# Patient Record
Sex: Male | Born: 1945 | Race: White | Hispanic: No | Smoking: Current every day smoker
Health system: Southern US, Community
[De-identification: ages and names within clinical notes are randomized; demographics above are authoritative.]

## PROBLEM LIST (undated history)

## (undated) DIAGNOSIS — M2550 Pain in unspecified joint: Secondary | ICD-10-CM

## (undated) DIAGNOSIS — K219 Gastro-esophageal reflux disease without esophagitis: Secondary | ICD-10-CM

## (undated) DIAGNOSIS — G5603 Carpal tunnel syndrome, bilateral upper limbs: Secondary | ICD-10-CM

## (undated) DIAGNOSIS — I10 Essential (primary) hypertension: Secondary | ICD-10-CM

## (undated) DIAGNOSIS — Z800 Family history of malignant neoplasm of digestive organs: Secondary | ICD-10-CM

## (undated) DIAGNOSIS — IMO0002 Reserved for concepts with insufficient information to code with codable children: Secondary | ICD-10-CM

## (undated) DIAGNOSIS — Z860100 Personal history of colon polyps, unspecified: Secondary | ICD-10-CM

## (undated) DIAGNOSIS — E119 Type 2 diabetes mellitus without complications: Secondary | ICD-10-CM

## (undated) DIAGNOSIS — B182 Chronic viral hepatitis C: Secondary | ICD-10-CM

## (undated) DIAGNOSIS — F32A Depression, unspecified: Secondary | ICD-10-CM

## (undated) DIAGNOSIS — M2540 Effusion, unspecified joint: Secondary | ICD-10-CM

## (undated) DIAGNOSIS — F329 Major depressive disorder, single episode, unspecified: Secondary | ICD-10-CM

## (undated) DIAGNOSIS — E785 Hyperlipidemia, unspecified: Secondary | ICD-10-CM

## (undated) DIAGNOSIS — M1711 Unilateral primary osteoarthritis, right knee: Secondary | ICD-10-CM

## (undated) HISTORY — DX: Depression, unspecified: F32.A

## (undated) HISTORY — DX: Major depressive disorder, single episode, unspecified: F32.9

## (undated) HISTORY — DX: Hyperlipidemia, unspecified: E78.5

## (undated) HISTORY — DX: Reserved for concepts with insufficient information to code with codable children: IMO0002

## (undated) HISTORY — DX: Carpal tunnel syndrome, bilateral upper limbs: G56.03

## (undated) HISTORY — DX: Family history of malignant neoplasm of digestive organs: Z80.0

## (undated) HISTORY — DX: Unilateral primary osteoarthritis, right knee: M17.11

## (undated) HISTORY — PX: HERNIA REPAIR: SHX51

## (undated) HISTORY — DX: Essential (primary) hypertension: I10

## (undated) HISTORY — DX: Chronic viral hepatitis C: B18.2

## (undated) HISTORY — DX: Type 2 diabetes mellitus without complications: E11.9

---

## 1999-04-12 ENCOUNTER — Ambulatory Visit (HOSPITAL_COMMUNITY): Admission: RE | Admit: 1999-04-12 | Discharge: 1999-04-12 | Payer: Self-pay | Admitting: Gastroenterology

## 2000-11-27 ENCOUNTER — Encounter: Admission: RE | Admit: 2000-11-27 | Discharge: 2000-11-27 | Payer: Self-pay | Admitting: Internal Medicine

## 2000-11-27 ENCOUNTER — Encounter: Payer: Self-pay | Admitting: Internal Medicine

## 2002-03-13 ENCOUNTER — Emergency Department (HOSPITAL_COMMUNITY): Admission: EM | Admit: 2002-03-13 | Discharge: 2002-03-13 | Payer: Self-pay | Admitting: Emergency Medicine

## 2002-03-13 ENCOUNTER — Encounter: Payer: Self-pay | Admitting: Emergency Medicine

## 2003-06-06 ENCOUNTER — Encounter: Admission: RE | Admit: 2003-06-06 | Discharge: 2003-06-06 | Payer: Self-pay | Admitting: Neurology

## 2003-06-06 ENCOUNTER — Encounter: Payer: Self-pay | Admitting: Neurology

## 2003-11-18 HISTORY — PX: OTHER SURGICAL HISTORY: SHX169

## 2004-01-18 ENCOUNTER — Ambulatory Visit (HOSPITAL_COMMUNITY): Admission: RE | Admit: 2004-01-18 | Discharge: 2004-01-18 | Payer: Self-pay | Admitting: Gastroenterology

## 2004-05-04 ENCOUNTER — Ambulatory Visit (HOSPITAL_COMMUNITY): Admission: RE | Admit: 2004-05-04 | Discharge: 2004-05-04 | Payer: Self-pay | Admitting: Gastroenterology

## 2004-07-16 ENCOUNTER — Ambulatory Visit: Payer: Self-pay | Admitting: Gastroenterology

## 2004-08-27 ENCOUNTER — Ambulatory Visit: Payer: Self-pay | Admitting: Gastroenterology

## 2004-09-06 ENCOUNTER — Ambulatory Visit: Payer: Self-pay | Admitting: Gastroenterology

## 2004-09-20 ENCOUNTER — Ambulatory Visit: Payer: Self-pay | Admitting: Gastroenterology

## 2004-10-18 ENCOUNTER — Ambulatory Visit: Payer: Self-pay | Admitting: Internal Medicine

## 2004-11-22 ENCOUNTER — Ambulatory Visit: Payer: Self-pay | Admitting: Gastroenterology

## 2005-01-03 ENCOUNTER — Ambulatory Visit: Payer: Self-pay | Admitting: Internal Medicine

## 2005-01-31 ENCOUNTER — Ambulatory Visit: Payer: Self-pay | Admitting: Internal Medicine

## 2005-10-27 ENCOUNTER — Emergency Department (HOSPITAL_COMMUNITY): Admission: EM | Admit: 2005-10-27 | Discharge: 2005-10-28 | Payer: Self-pay | Admitting: Emergency Medicine

## 2006-06-04 ENCOUNTER — Encounter: Admission: RE | Admit: 2006-06-04 | Discharge: 2006-06-04 | Payer: Self-pay | Admitting: Neurology

## 2006-06-18 ENCOUNTER — Emergency Department (HOSPITAL_COMMUNITY): Admission: EM | Admit: 2006-06-18 | Discharge: 2006-06-18 | Payer: Self-pay | Admitting: Emergency Medicine

## 2007-07-04 ENCOUNTER — Emergency Department (HOSPITAL_COMMUNITY): Admission: EM | Admit: 2007-07-04 | Discharge: 2007-07-04 | Payer: Self-pay | Admitting: Emergency Medicine

## 2007-08-20 HISTORY — PX: OTHER SURGICAL HISTORY: SHX169

## 2007-11-03 ENCOUNTER — Emergency Department (HOSPITAL_COMMUNITY): Admission: EM | Admit: 2007-11-03 | Discharge: 2007-11-04 | Payer: Self-pay | Admitting: Emergency Medicine

## 2007-12-14 ENCOUNTER — Ambulatory Visit (HOSPITAL_COMMUNITY): Admission: RE | Admit: 2007-12-14 | Discharge: 2007-12-14 | Payer: Self-pay | Admitting: *Deleted

## 2008-03-13 ENCOUNTER — Emergency Department (HOSPITAL_COMMUNITY): Admission: EM | Admit: 2008-03-13 | Discharge: 2008-03-13 | Payer: Self-pay | Admitting: Emergency Medicine

## 2008-03-21 ENCOUNTER — Emergency Department (HOSPITAL_COMMUNITY): Admission: EM | Admit: 2008-03-21 | Discharge: 2008-03-21 | Payer: Self-pay | Admitting: Emergency Medicine

## 2008-03-24 ENCOUNTER — Ambulatory Visit (HOSPITAL_COMMUNITY): Admission: RE | Admit: 2008-03-24 | Discharge: 2008-03-25 | Payer: Self-pay | Admitting: Orthopedic Surgery

## 2009-01-19 ENCOUNTER — Inpatient Hospital Stay (HOSPITAL_COMMUNITY): Admission: RE | Admit: 2009-01-19 | Discharge: 2009-01-21 | Payer: Self-pay | Admitting: Psychiatry

## 2009-01-19 ENCOUNTER — Ambulatory Visit: Payer: Self-pay | Admitting: Psychiatry

## 2009-01-19 ENCOUNTER — Other Ambulatory Visit: Payer: Self-pay

## 2009-05-08 ENCOUNTER — Encounter: Admission: RE | Admit: 2009-05-08 | Discharge: 2009-05-08 | Payer: Self-pay | Admitting: Internal Medicine

## 2010-06-24 ENCOUNTER — Encounter: Admission: RE | Admit: 2010-06-24 | Discharge: 2010-06-24 | Payer: Self-pay | Admitting: Internal Medicine

## 2010-10-12 ENCOUNTER — Other Ambulatory Visit: Payer: Self-pay | Admitting: Internal Medicine

## 2010-10-12 DIAGNOSIS — B182 Chronic viral hepatitis C: Secondary | ICD-10-CM

## 2010-10-29 ENCOUNTER — Ambulatory Visit
Admission: RE | Admit: 2010-10-29 | Discharge: 2010-10-29 | Disposition: A | Discharge status: Home / Self Care | Payer: Federal, State, Local not specified - PPO | Source: Ambulatory Visit | Attending: Internal Medicine | Admitting: Internal Medicine

## 2010-10-29 DIAGNOSIS — B182 Chronic viral hepatitis C: Secondary | ICD-10-CM

## 2010-10-29 MED ORDER — GADOBENATE DIMEGLUMINE 529 MG/ML IV SOLN
20.0000 mL | Freq: Once | INTRAVENOUS | Status: AC | PRN
  Administered 2010-10-29: 20 mL via INTRAVENOUS

## 2010-11-26 LAB — BASIC METABOLIC PANEL
Calcium: 9.4 mg/dL (ref 8.4–10.5)
GFR calc Af Amer: >60 mL/min (ref >60)
GFR calc non Af Amer: >60 mL/min (ref >60)
Sodium: 141 mEq/L (ref 135–145)

## 2010-11-26 LAB — DIFFERENTIAL
Lymphocytes Relative: 25 % (ref 12–46)
Monocytes Absolute: 0.8 10*3/uL (ref 0.1–1.0)
Monocytes Relative: 10 % (ref 3–12)
Neutro Abs: 4.9 10*3/uL (ref 1.7–7.7)

## 2010-11-26 LAB — GLUCOSE, CAPILLARY: Glucose-Capillary: 125 mg/dL — ABNORMAL HIGH (ref 70–99)

## 2010-11-26 LAB — TSH: TSH: 0.83 u[IU]/mL (ref 0.350–4.500)

## 2010-11-26 LAB — HEPATIC FUNCTION PANEL
ALT: 64 U/L — ABNORMAL HIGH (ref 0–53)
Albumin: 3.4 g/dL — ABNORMAL LOW (ref 3.5–5.2)
Alkaline Phosphatase: 47 U/L (ref 39–117)
Total Protein: 6.3 g/dL (ref 6.0–8.3)

## 2010-11-26 LAB — RAPID URINE DRUG SCREEN, HOSP PERFORMED
Benzodiazepines: NOT DETECTED
Cocaine: NOT DETECTED
Tetrahydrocannabinol: NOT DETECTED

## 2010-11-26 LAB — CBC
Hemoglobin: 16.3 g/dL (ref 13.0–17.0)
RBC: 5.14 MIL/uL (ref 4.22–5.81)
WBC: 7.8 10*3/uL (ref 4.0–10.5)

## 2010-11-26 LAB — TRICYCLICS SCREEN, URINE: TCA Scrn: NOT DETECTED

## 2010-12-13 ENCOUNTER — Emergency Department (HOSPITAL_COMMUNITY)
Admission: EM | Admit: 2010-12-13 | Discharge: 2010-12-13 | Disposition: A | Discharge status: Home / Self Care | Payer: Federal, State, Local not specified - PPO | Attending: Emergency Medicine | Admitting: Emergency Medicine

## 2010-12-13 DIAGNOSIS — M62838 Other muscle spasm: Secondary | ICD-10-CM | POA: Insufficient documentation

## 2010-12-13 DIAGNOSIS — IMO0001 Reserved for inherently not codable concepts without codable children: Secondary | ICD-10-CM | POA: Insufficient documentation

## 2010-12-13 DIAGNOSIS — E119 Type 2 diabetes mellitus without complications: Secondary | ICD-10-CM | POA: Insufficient documentation

## 2010-12-13 DIAGNOSIS — R229 Localized swelling, mass and lump, unspecified: Secondary | ICD-10-CM | POA: Insufficient documentation

## 2010-12-13 DIAGNOSIS — I10 Essential (primary) hypertension: Secondary | ICD-10-CM | POA: Insufficient documentation

## 2010-12-13 DIAGNOSIS — Z8619 Personal history of other infectious and parasitic diseases: Secondary | ICD-10-CM | POA: Insufficient documentation

## 2010-12-18 ENCOUNTER — Other Ambulatory Visit: Payer: Self-pay | Admitting: Internal Medicine

## 2010-12-18 DIAGNOSIS — M79652 Pain in left thigh: Secondary | ICD-10-CM

## 2010-12-19 ENCOUNTER — Ambulatory Visit
Admission: RE | Admit: 2010-12-19 | Discharge: 2010-12-19 | Disposition: A | Discharge status: Home / Self Care | Payer: Federal, State, Local not specified - PPO | Source: Ambulatory Visit | Attending: Internal Medicine | Admitting: Internal Medicine

## 2010-12-19 DIAGNOSIS — M79652 Pain in left thigh: Secondary | ICD-10-CM

## 2011-01-01 NOTE — Op Note (Signed)
NAME:  TIA, GELB NO.:  [id number]   MEDICAL RECORD NO.:  [id number]          PATIENT TYPE:  OIB   LOCATION:  5012                         FACILITY:  [facility]   PHYSICIAN:  Nadara Mustard, MD     DATE OF BIRTH:  Jun 16, 1946   DATE OF PROCEDURE:  DATE OF DISCHARGE:                               OPERATIVE REPORT   PREOPERATIVE DIAGNOSIS:  Left mid shaft humerus fracture.   POSTOPERATIVE DIAGNOSIS:  Left mid shaft humerus fracture.   PROCEDURE:  Open reduction internal fixation left humerus with locking  large frag plate with 7-hole plate, six screws total with a total of 12  cortices.   SURGEON.:  Nadara Mustard, MD   ANESTHESIA:  General.   ESTIMATED BLOOD LOSS:  Minimal.   ANTIBIOTICS:  Kefzol 1 g.   DRAINS:  None.   COMPLICATIONS:  None.   TOURNIQUET TIME:  None.   DISPOSITION:  To PACU in stable condition.   INDICATIONS FOR PROCEDURE:  The patient is a 65 year old gentleman who  is about a weeks' status post a left midshaft humerus fracture.  The  patient was attempted with closed treatment with Sarmiento type brace.  The patient was unable to maintain the brace in place.  He continues to  remove the brace, had displacement of the fracture, and presented at  this time for open reduction internal fixation.  Risks and benefits were  discussed including infection, neurovascular injury, injury of the  radial nerve, nonhealing of the bone fracture.  The bone needs  additional surgery.  The patient states he understands and wished  proceed at this time.   DESCRIPTION OF PROCEDURE:  The patient was brought to OR room #1 and  underwent general anesthetic.  After adequate level of anesthesia was  obtained, the patient's left upper extremity was prepped using DuraPrep  and draped into a sterile field.  An anterolateral incision was made.  This was carried down to the biceps, the biceps was retracted medially  and the brachialis was split longitudinally  and the mid substance.  The  fracture was identified.  The fibrous tissue around the fracture site  was cleansed, debrided, and the fracture was reduced.  Using a locking  plate, a nonlocking screw was first placed distally to further reduce  the fracture and then a second nonlocking screw was placed proximally to  compress the fracture.  Once the fracture was compressed and stabilized,  two additional locking screws were placed proximally and distally for a  total of three screws proximally, three screws distally with a total of  12 cortices of contact.  The C-arm fluoroscopy verified reduction in  both AP and lateral planes.  The  wound was irrigated.  The subcu was closed using 2-0 Vicryl.  The skin  was closed using Proximate staples.  The wound was covered with Adaptic,  orthopedic sponges, ABD dressing, Webril, and Coban.  The patient was  extubated, taken to PACU in stable condition, and plan for a 23-hour  observation.      Nadara Mustard, MD  Electronically Signed  MVD/MEDQ  D:  03/24/2008  T:  03/25/2008  Job:  [id number]

## 2011-01-01 NOTE — Op Note (Signed)
NAME:  Brendan Joseph, Brendan Joseph NO.:  [id number]   MEDICAL RECORD NO.:  [id number]          PATIENT TYPE:  AMB   LOCATION:  DAY                          FACILITY:  [facility]   PHYSICIAN:  Alfonse Ras, MD   DATE OF BIRTH:  04-13-1946   DATE OF PROCEDURE:  12/14/2007  DATE OF DISCHARGE:                               OPERATIVE REPORT   PREOPERATIVE DIAGNOSIS:  Incarcerated umbilical hernia.   POSTOPERATIVE DIAGNOSIS:  Incarcerated umbilical hernia.   PROCEDURE:  Umbilical hernia repair.   SURGEON:  Alfonse Ras, MD.   ANESTHESIA:  General.   DESCRIPTION:  The patient was taken to the operating room, placed in the  supine position.  After adequate general anesthesia was induced using  endotracheal tube, the abdomen was prepped and draped in normal sterile  fashion.  Using a transverse infraumbilical incision, I dissected down  onto the hernia sac which was mobilized off the fascia.  It was  mobilized completely down to the fascial defect which measured  approximately 1 cm or less.  After the edges had been freed up, it was  closed primarily with 0 Surgilon figure-of-eight sutures.  Because of a  very small size of the defect, I opted not to place mesh and adequate  hemostasis was ensured.  The subcuticular closure was performed using a  4-0 Monocryl.  All tissues were injected using 0.5 Marcaine.  Sterile  dressing was applied.  The patient tolerated the procedure well and went  to PACU in good condition.      Alfonse Ras, MD  Electronically Signed     KRE/MEDQ  D:  12/14/2007  T:  12/15/2007  Job:  [id number]

## 2011-01-04 NOTE — Consult Note (Signed)
NAME:  Brendan Joseph, Brendan Joseph NO.:  [id number]   MEDICAL RECORD NO.:  [id number]          PATIENT TYPE:  INP   LOCATION:  1505                         FACILITY:  [facility]   PHYSICIAN:  Candyce Churn, M.D.DATE OF BIRTH:  05-11-1946   DATE OF CONSULTATION:  10/28/2005  DATE OF DISCHARGE:                                   CONSULTATION   FINDINGS:  1.  Acute asthma exacerbation, resolving with normal O2 sat on room air.  2.  Hypertension.  3.  Depression.  4.  History of chronic hepatitis C.  5.  History of chronic alcohol abuse - one bottle of wine a day.   DISCHARGE MEDICATIONS:  1.  Prednisone taper 50 mg tapering down to 10 mg every 10 days - reducing      by 10 mg every other day.  2.  Z-Pak.  3.  Wellbutrin XL 150 mg daily.  4.  Micardis 80 mg daily.  5.  Lexapro 10 mg daily.  6.  Albuterol inhaler 2 every 4 hours for 5 days - while awake - and p.r.n.      severe wheezing. Then 2 puffs q.i.d. p.r.n.   DISCUSSION:  Mr. Brendan Joseph is a very pleasant 65 year old male with the  above medical problems who started wheezing on Thursday. He has had asthma  for about 10 years and never had an exacerbation this bad. He received a  steroid injection at 2 p.m. on October 27, 2005 because of cough and wheezing  - bringing up clear phlegm - but continued to cough and wheeze and came to  the [facility] emergency room approximately 10 p.m.   He was treated with continuous nebs of albuterol and atropine as well as  intravenous Solu-Medrol and by 6:40 a.m. on October 28, 2005 his lungs were  clear without wheeze and O2 saturations on room air were above 93%. He had  no respiratory distress, felt well and was requesting discharge.   PHYSICAL EXAMINATION:  VITAL SIGNS:  His blood pressure was 156/59, heart  rate 78, temperature 97.2, respiratory rate 18 and nonlabored. O2 saturation  on room air was 92%.  CHEST:  Clear to auscultation.  CARDIAC:  Revealed a regular rhythm,  no murmurs or gallops.  ABDOMEN:  Soft, nontender.  EXTREMITIES:  Without cyanosis, clubbing or edema.  NEUROLOGIC:  Oriented x3, no tremor noted.   EKG revealed sinus rhythm at 83 with a normal EKG.   LABORATORY DATA:  Laboratories revealed a white count of 10,100, hemoglobin  14.3, platelet count 251,000. Pro time was 13.3 seconds, PTT 27 seconds.  Sodium 138, potassium 4.1, chloride 102, bicarb, 26, glucose 171, BUN 16,  creatinine 1.1. Bilirubin 0.5, alkaline phosphatase 64, SGOT 51, SGPT 60.  Total protein 6.9, albumin 3.6. Calcium 8.7. CK was 613, CK-MB 11.5.  Troponin 0.03 and relative index 1.9.   The patient was discharged in much improved condition and will be followed  up within 1 week at [facility] and I will notify Dr.  Valentina Lucks of the ER visit. He does have a mild degree of elevated  sugar but  was given a shot of IM steroids earlier in the day on October 27, 2005. Will  call in prednisone taper to [facility].      Candyce Churn, M.D.  Electronically Signed     RNG/MEDQ  D:  10/28/2005  T:  10/29/2005  Job:  [id number]

## 2011-01-04 NOTE — Op Note (Signed)
NAME:  [name], [name] NO.:  [id number]   MEDICAL RECORD NO.:  [id number]                   PATIENT TYPE:  AMB   LOCATION:  ENDO                                 FACILITY:  [facility]   PHYSICIAN:  Danise Edge, M.D.                DATE OF BIRTH:  05-19-1946   DATE OF PROCEDURE:  01/18/2004  DATE OF DISCHARGE:                                 OPERATIVE REPORT   PROCEDURE:  Colonoscopy.   PROCEDURE INDICATION:  Brendan Joseph is a 65 year old male born  June 03, 1946.  Brendan Joseph underwent a colonoscopy 5 years ago and  neoplastic polyps were removed.  His mother had colon cancer.   ENDOSCOPIST:  Danise Edge, M.D.   PREMEDICATION:  1. Versed 7.5 mg.  2. Demerol 50 mg.   PROCEDURE:  After obtaining informed consent, Brendan Joseph was placed in the  left lateral decubitus position.  I administered intravenous Demerol and  intravenous Versed to achieve conscious sedation for the procedure.  The  patient's blood pressure, oxygen saturation and cardiac rhythm were  monitored throughout the procedure and documented in the medical record.   Anal inspection and digital rectal exam were normal.  The Olympus adjustable  pediatric colonoscope was introduced into the rectum and advanced to the  cecum.  Colonic preparation of the exam today was excellent.   Rectum normal.   Sigmoid colon and descending colon normal.   Splenic flexure normal.   Transverse colon normal.   Hepatic flexure normal.   Ascending colon normal.   Cecum and ileocecal valve normal.   ASSESSMENT:  Normal proctocolonoscopy to the cecum.   RECOMMENDATIONS:  Repeat colonoscopy in five years.                                               Danise Edge, M.D.    MJ/MEDQ  D:  01/18/2004  T:  01/18/2004  Job:  [id number]   cc:   Thora Lance, M.D.  [address]  [place]  [place] [address]  Fax: [phone]

## 2011-01-04 NOTE — Discharge Summary (Signed)
NAME:  Brendan Joseph, Brendan Joseph NO.:  [id number]   MEDICAL RECORD NO.:  [id number]          PATIENT TYPE:  IPS   LOCATION:  0500                          FACILITY:  [facility]   PHYSICIAN:  Geoffery Lyons, M.D.      DATE OF BIRTH:  Jan 22, 1946   DATE OF ADMISSION:  01/19/2009  DATE OF DISCHARGE:  01/21/2009                               DISCHARGE SUMMARY   IDENTIFYING INFORMATION:  A 65 year old male, divorced.  This is a  voluntary admission.   HISTORY OF PRESENT ILLNESS:  Second or third [facility] admission for this 51-  year-old gentleman with a long history of alcohol addiction.  He has  some episodes of sobriety in the past, had relapsed several months ago  with rapid escalation in the use of his alcohol and has been drinking  about a fifth of alcohol every day now for the past couple of months.  He has attempted to cut back on his drinking and when he does get shaky,  sweaty, begins to get a little bit confused and recognizes that he  cannot function without the alcohol.  Is asking for detox so that he can  then resume Alcoholics Anonymous meetings and try to reachieve some  sobriety.  He does endorse being stressed by ongoing problems with loss  of relationship with his wife, still grieving loss the relationship.  He  had previously presented to the emergency room a couple of times,  getting prescriptions for Ativan.  Was unable to completely clear the  alcohol and achieve any sobriety.  Admits to some social isolation.   PAST PSYCHIATRIC HISTORY:  History of previous treatment at [facility] x2 about 5 years ago and drank immediately after discharge.  Also  previously diagnosed with ADHD in the past and depression NOS.  He is  currently treated as an outpatient by Dr. Betti Cruz who has been treating  him for depression.  His medication compliance is unclear.   SOCIAL HISTORY:  Lives alone with 2 animals, a large dog and a cat that  he cares for that are very meaningful in his  life.  He is retired from  the [employer].  Endorses some social isolation.   MEDICAL HISTORY:  1. History of hepatitis C.  2. Dyslipidemia.  3. Some elevated blood pressure.  4. Diabetes mellitus type 2; all currently pretty stable on his      medications.   CURRENT MEDICATIONS:  1. Micardis hydrochlorothiazide 80/25 mg daily.  2. Singulair 10 mg daily.  3. Amlodipine 5 mg daily.  4. Pristiq 100 mg daily.  5. Glimepiride 1 mg daily.  6. Methylphenidate 20 mg daily.  7. Lipitor 20 mg daily.  8. Symbicort 160/4.5 mg 2 puffs b.i.d.   DRUG ALLERGIES:  Are none.   PHYSICAL EXAMINATION:  Was done in the emergency room.  This was an  overweight gentleman with a flush complexion and appears rather  disheveled with moist skin.  Appears to be in full detox with a mild  motor tremor, fully alert and cooperative.  Temperature 98.5, pulse 119,  respirations 20, blood pressure  136/57.   DIAGNOSTIC STUDIES:  Remarkable for urine drug screen.  Negative for all  substances.  CBC:  WBC 7.8, hemoglobin 16.3, hematocrit 49.3, platelets  287,000 and MCV 96.  Initial alcohol level 166.  Chemistry:  Sodium 141,  potassium 3.7, chloride 104, carbon dioxide 25, BUN 11, creatinine 0.83  and random glucose 120.  Mental status exam reveals a fully alert male,  pleasant, cooperative, good insight, actually wanting to leave to care  for his animals.  No suicidal thoughts, recognizing that he needs detox.  In full contact with reality.  No evidence of delirium tremors or  psychosis.  No dangerous thoughts.  Motor exam significant for some mild  tremor but no frank ataxia.  Denying a history of seizures or DTs.   ADMITTING DIAGNOSES:  AXIS I:  Alcohol abuse and dependence.  AXIS II:  No diagnosis.  AXIS III:  Dyslipidemia.  Type 2 diabetes, controlled.  Hypertension,  controlled.  AXIS IV:  Significant chronic ongoing grief over relationship loss.  AXIS V:  Current 48; past year 6.   COURSE OF  HOSPITALIZATION:  He was admitted to our dual diagnosis unit  and started on a Librium protocol.  He received Librium 25 mg q.i.d. on  the first day and responded pretty well.  By 6:00 p.m. on that day, he  had significantly less flushing.  CIWA score decreased from of 14 in the  morning to 5 that evening.  Felt less tremulous.  Remaining concerned  about his animals.  Continued to have some broken sleep.  No suicidal  thoughts.  By the morning of June 5, he was feeling significantly  better.  CIWA score was normalizing.  We did not at this point restart  his Pristiq or methylphenidate but deferred this following detox, and he  understood that and was in agreement with the plan.  Since he was  feeling so much better on the morning of June 5, he was requesting to  follow up as an outpatient to complete his Librium taper at home and  wanted to return home to care for his animals and did not have adequate  support for caring for them.  We essentially agreed with that plan.   DISCHARGE MENTAL STATUS EXAM:  Fully alert male, pleasant, cooperative.  CIWA normalizing less than 5.  No tremor.  Skin:  Less flush and blood  pressure within normal limits.  No dangerous thoughts.  Speech normal.  In full contact with reality.  No evidence of confusion or delirium.  No  evidence of psychosis or confusion.  Insight good.  Impulse control and  judgment within normal limits.   DISCHARGE DIAGNOSES:  AXIS I:  Alcohol abuse and dependence.  AXIS II:  No diagnosis.  AXIS III:  Diabetes type 2, controlled.  Hypertension, controlled.  AXIS IV:  Chronic issues with grief over loss of relationship.  AXIS V:  Current 62; past year 24 estimated.   PLAN:  Is to discharge him today.  He is discharged.   DISCHARGE MEDICATIONS:  He is to resume his previous blood pressure and  medical medications.  He is instructed to not restart his Pristiq or  methylphenidate until after he meets his psychiatrist, Dr. Betti Cruz, and  he  is  discharged with Librium 25 mg 1 capsule at lunch and bedtime on June 5,  three times a day on June 6, then twice a day, June 7 and 8th, and once  daily June 9 and 10,  and then stop.  He will arrange his own follow-up  with Dr. Ellamae Sia, his primary psychiatrist.      Young Berry. Scott, N.P.      Geoffery Lyons, M.D.  Electronically Signed    MAS/MEDQ  D:  01/23/2009  T:  01/23/2009  Job:  [id number]

## 2011-01-20 ENCOUNTER — Observation Stay (HOSPITAL_COMMUNITY)
Admission: EM | Admit: 2011-01-20 | Discharge: 2011-01-21 | Disposition: A | Discharge status: Home / Self Care | Payer: Federal, State, Local not specified - PPO | Attending: Internal Medicine | Admitting: Internal Medicine

## 2011-01-20 ENCOUNTER — Emergency Department (HOSPITAL_COMMUNITY): Payer: Federal, State, Local not specified - PPO

## 2011-01-20 DIAGNOSIS — R079 Chest pain, unspecified: Principal | ICD-10-CM | POA: Insufficient documentation

## 2011-01-20 DIAGNOSIS — F17200 Nicotine dependence, unspecified, uncomplicated: Secondary | ICD-10-CM | POA: Insufficient documentation

## 2011-01-20 DIAGNOSIS — B1920 Unspecified viral hepatitis C without hepatic coma: Secondary | ICD-10-CM | POA: Insufficient documentation

## 2011-01-20 DIAGNOSIS — E119 Type 2 diabetes mellitus without complications: Secondary | ICD-10-CM | POA: Insufficient documentation

## 2011-01-20 DIAGNOSIS — Z79899 Other long term (current) drug therapy: Secondary | ICD-10-CM | POA: Insufficient documentation

## 2011-01-20 DIAGNOSIS — J45909 Unspecified asthma, uncomplicated: Secondary | ICD-10-CM | POA: Insufficient documentation

## 2011-01-20 DIAGNOSIS — E663 Overweight: Secondary | ICD-10-CM | POA: Insufficient documentation

## 2011-01-20 DIAGNOSIS — I10 Essential (primary) hypertension: Secondary | ICD-10-CM | POA: Insufficient documentation

## 2011-01-20 DIAGNOSIS — E785 Hyperlipidemia, unspecified: Secondary | ICD-10-CM | POA: Insufficient documentation

## 2011-01-21 ENCOUNTER — Encounter (HOSPITAL_COMMUNITY): Payer: Self-pay

## 2011-01-21 LAB — COMPREHENSIVE METABOLIC PANEL
AST: 29 U/L (ref 0–37)
Albumin: 3.6 g/dL (ref 3.5–5.2)
Alkaline Phosphatase: 97 U/L (ref 39–117)
BUN: 15 mg/dL (ref 6–23)
CO2: 25 mEq/L (ref 19–32)
Chloride: 103 mEq/L (ref 96–112)
Creatinine, Ser: 0.74 mg/dL (ref 0.4–1.5)
GFR calc Af Amer: >60 mL/min (ref >60)
GFR calc non Af Amer: >60 mL/min (ref >60)
Potassium: 3.6 mEq/L (ref 3.5–5.1)
Total Bilirubin: 0.4 mg/dL (ref 0.3–1.2)

## 2011-01-21 LAB — PHOSPHORUS: Phosphorus: 4.4 mg/dL (ref 2.3–4.6)

## 2011-01-21 LAB — LIPID PANEL
Cholesterol: 132 mg/dL (ref 0–200)
LDL Cholesterol: 77 mg/dL (ref 0–99)
Total CHOL/HDL Ratio: 3.7 RATIO

## 2011-01-21 LAB — DIFFERENTIAL
Basophils Absolute: 0.1 10*3/uL (ref 0.0–0.1)
Lymphocytes Relative: 26 % (ref 12–46)
Monocytes Absolute: 1.2 10*3/uL — ABNORMAL HIGH (ref 0.1–1.0)
Neutro Abs: 8.1 10*3/uL — ABNORMAL HIGH (ref 1.7–7.7)
Neutrophils Relative %: 63 % (ref 43–77)

## 2011-01-21 LAB — CK TOTAL AND CKMB (NOT AT ARMC)
CK, MB: 4.4 ng/mL — ABNORMAL HIGH (ref 0.3–4.0)
CK, MB: 4 ng/mL (ref 0.3–4.0)
CK, MB: 3.5 ng/mL (ref 0.3–4.0)
Relative Index: 1.6 (ref 0.0–2.5)
Relative Index: 1.8 (ref 0.0–2.5)
Relative Index: 2 (ref 0.0–2.5)
Total CK: 278 U/L — ABNORMAL HIGH (ref 7–232)
Total CK: 171 U/L (ref 7–232)

## 2011-01-21 LAB — CBC
HCT: 42.1 % (ref 39.0–52.0)
Hemoglobin: 13.7 g/dL (ref 13.0–17.0)
MCHC: 32.5 g/dL (ref 30.0–36.0)
RBC: 4.85 MIL/uL (ref 4.22–5.81)

## 2011-01-21 LAB — TSH: TSH: 0.887 u[IU]/mL (ref 0.350–4.500)

## 2011-01-21 LAB — TROPONIN I
Troponin I: <0.3 ng/mL (ref <0.30)
Troponin I: <0.3 ng/mL (ref <0.30)

## 2011-01-21 LAB — PRO B NATRIURETIC PEPTIDE: Pro B Natriuretic peptide (BNP): 17.9 pg/mL (ref 0–125)

## 2011-01-21 LAB — GLUCOSE, CAPILLARY

## 2011-01-22 NOTE — Consult Note (Signed)
NAME:  Brendan Joseph, Brendan Joseph NO.:  [id number]  MEDICAL RECORD NO.:  [id number]  LOCATION:  1437                         FACILITY:  [facility]  PHYSICIAN:  Jake Bathe, MD      DATE OF BIRTH:  02/10/1946  DATE OF CONSULTATION:  01/21/2011 DATE OF DISCHARGE:  01/21/2011                                CONSULTATION   PRIMARY PHYSICIAN:  Thora Lance, MD  REASON FOR CONSULTATION:  Evaluation of chest pain.  HISTORY OF PRESENT ILLNESS:  Brendan Joseph is a 65 year old male with diabetes, hypertension, hyperlipidemia, and overweight who smokes up to 3 cigars a day, inhales who was admitted with epigastric/substernal chest discomfort that was fairly continuous over the weekend.  He felt nauseous, it was located in the epigastric region and felt as though it was indigestion.  He ended up playing golf approximately 36 holes and while he was playing sort of forgot about his discomfort, but then once he returned home he felt the continued uncomfortable feeling in his epigastric/chest region.  He tried Alka-Seltzer and Tums, but nothing gave him relief.  He ended up coming to the emergency room for further evaluation.  Denies any radiation of symptoms.  No significant shortness of breath.  No diaphoresis.  He did feel warm the night before and states that he disrobed down to his underwear to try to cool off.  PAST MEDICAL HISTORY: 1. Asthma. 2. Hypertension. 3. Diabetes. 4. Hepatitis C. 5. Hyperlipidemia. 6. Hernia repair.  ALLERGIES:  No known drug allergies.  MEDICATIONS:  Amlodipine, Lipitor, Micardis, Singulair, Symbicort, and glimepiride.  SOCIAL HISTORY:  He lives in [place], smokes 3 cigars a day, inhales. He does not drink any more, but 2 years ago drank quite heavily.  Denied any other illicit drug use, currently divorced.  FAMILY HISTORY:  No early family history of coronary artery disease.  REVIEW OF SYSTEMS:  No bleeding.  No syncope.  No palpitations.   No fevers or chills.  No diarrhea.  No sick contacts.  Unless specified above, all other 12 review of systems negative.  Symptoms did not change with food.  PHYSICAL EXAMINATION:  VITAL SIGNS:  Temperature 98.2, blood pressure 114/41, pulse 65, saturating 94% on room air, and respirations 18. GENERAL:  Alert and oriented x3, in no acute distress. EYES:  Well-perfused conjunctivae.  EOMI.  No scleral icterus. NECK:  Supple.  No lymphadenopathy.  No thyromegaly.  No carotid bruits. No JVD. CARDIOVASCULAR:  Regular rate and rhythm without any appreciable murmurs, rubs, or gallops. LUNGS:  Clear to auscultation bilaterally.  Normal respiratory effort. ABDOMEN:  Overweight.  Normoactive bowel sounds.  No rebound, no guarding, no bruits. EXTREMITIES:  No clubbing, cyanosis, or edema.  Normal distal pulses. GU:  Deferred. RECTAL:  Deferred. NEUROLOGIC:  Cranial nerves II-XII are grossly intact.  Ambulating well without any difficulty.  LABORATORY DATA:  Chest x-ray personally reviewed shows no active pulmonary disease.  Cardiac biomarkers are negative.  Third set is currently pending.  TSH is normal.  LDL is 77, HDL is 36.  BNP is normal.  Lipase is normal.  Liver functions are normal.  Glucose is 64. White count 12.9 mildly elevated,  hemoglobin 13.7, hematocrit 42.1, and platelets 315.  Creatinine 0.74.  Prior medical records are reviewed. Echocardiogram demonstrates normal ejection fraction, mild LVH, mild aortic sclerosis, otherwise unremarkable.  ASSESSMENT/PLAN:  This is a 65 year old male with diabetes, hypertension, hyperlipidemia, tobacco use, overweight with prior hepatitis C with chest pain.  Chest pain - currently chest pain free.  Diabetes is a coronary artery disease equivalent.  Currently, his cardiac biomarkers are reassuring as well as his EKG.  His EKG demonstrates sinus rhythm, rate 64 with no other abnormalities present.  No Q-waves.  Poor R-wave progression noted.   Certainly, his symptoms could be related to gastroenteritis, however, given his risk factor profile, I would like to proceed with an outpatient stress test evaluation which we can go ahead and set up for him.  I have discussed this with Dr. Kirby Funk, his primary physician and we are all in agreement with this.  The patient is in agreement with this.  Certainly if his symptoms worsen or become more worrisome, he knows to contact us immediately.  Tobacco cessation counseling has been given.  This will be very important.  LFTs seem to be stable in regards to his hepatitis.  LDL is fairly well controlled with his statin medication and his BNP was reassuring.  Certainly if his nuclear stress test is abnormal in any fashion, we will proceed with cardiac catheterization.  I did speak to him about cardiac catheterization and I did relay to him that it would not be unreasonable given his strong risk factor profile and symptoms to proceed with this, however, we all agree towards a noninvasive approach first especially in the light of normal cardiac markers and EKG and weight loss also.     Jake Bathe, MD     MCS/MEDQ  D:  01/21/2011  T:  01/22/2011  Job:  [id number]  Electronically Signed by Donato Schultz MD on 01/22/2011 06:26:17 AM

## 2011-02-07 NOTE — Discharge Summary (Signed)
  NAME:  Brendan Joseph, Brendan Joseph NO.:  [id number]  MEDICAL RECORD NO.:  [id number]  LOCATION:  1437                         FACILITY:  [facility]  PHYSICIAN:  Thora Lance, M.D.  DATE OF BIRTH:  03/30/1946  DATE OF ADMISSION:  01/20/2011 DATE OF DISCHARGE:  01/21/2011                              DISCHARGE SUMMARY   REASON FOR ADMISSION:  The patient is a 65 year old gentleman with history of hypertension, diabetes, and hyperlipidemia, who presented with chest pain on the morning before admission, was associated with some nausea and vomiting.  He came to the ER with these complaints.  SIGNIFICANT FINDINGS:  VITAL SIGNS:  Blood pressure 114/41, heart rate 65, respirations 18, temperature 98.2. LUNGS:  Clear. HEART:  Regular rate and rhythm without murmur, gallops, or rub. EXTREMITIES:  Showed no edema.  LABORATORY DATA:  WBCs 12.9, hemoglobin 13.7, platelet count 315.  CK 278, CK-MB 4.4, troponin normal.  Lipase 31, sodium 140, potassium 3.6, chloride 103, bicarbonate 25, glucose 64, BUN 15, creatinine 0.74, calcium 9.6, total protein 7.1, albumin 3.6.  BNP 31.  Chest x-ray, no evidence of acute pulmonary disease.  EKG shows sinus rhythm with no significant ST-T wave changes.  HOSPITAL COURSE:  The patient was admitted for chest pain.  He had cardiac enzymes cycled x3 which were negative.  He was seen in consultation by Cardiology.  An echocardiogram was done and showed normal left ventricular cavity size and function, mild concentric LVH, no regional wall motion abnormality, and grade 1 diastolic dysfunction. The patient felt much better by the next hospital day.  He was seen by Dr. Anne Fu of Cardiology who felt that an outpatient stress test will be reasonable.  The patient was discharged in good condition.  DISCHARGE DIAGNOSES: 1. Chest pain. 2. Diabetes mellitus. 3. Hypertension. 4. Hepatitis C. 5. Hyperlipidemia. 6. History of alcoholism. 7.  Asthma.  PROCEDURES:  Echocardiogram.  DISCHARGE MEDICATIONS: 1. Amlodipine 5 mg one p.o. daily. 2. Aspirin 325 mg 1 tablet once a day. 3. Glimepiride 2 mg 1 tablet once a day. 4. Lipitor 20 mg 1 tablet once a day. 5. Micardis HCT 80/25 one tablet once a day. 6. Multivitamin 1 tablet once a day. 7. Singulair 10 mg 1 tablet once a day. 8. Symbicort 160/4.5 two inhalations twice a day. 9. Vitamin D 1000 units 1 tablet once a day.  DISPOSITION:  Discharged to home.  Follow up with Cardiology for stress test.  Activity as tolerated.  CODE STATUS:  Full code.  DIET:  Low-sodium diet.          ______________________________ Thora Lance, M.D.     JJG/MEDQ  D:  01/31/2011  T:  02/01/2011  Job:  [id number]  Electronically Signed by Kirby Funk M.D. on 02/07/2011 06:21:45 PM

## 2011-02-17 NOTE — H&P (Signed)
NAME:  Brendan Joseph, Brendan Joseph NO.:  [id number]  MEDICAL RECORD NO.:  [id number]           PATIENT TYPE:  E  LOCATION:  WLED                         FACILITY:  [facility]  PHYSICIAN:  Lonia Blood, M.D.      DATE OF BIRTH:  Sep 14, 1945  DATE OF ADMISSION:  01/20/2011 DATE OF DISCHARGE:                             HISTORY & PHYSICAL   PRIMARY CARE PHYSICIAN:  Thora Lance, M.D.  PRESENTING COMPLAINT:  Chest pain.  HISTORY OF PRESENT ILLNESS:  The patient is a 65 year old gentleman with history of hypertension, diabetes, and hyperlipidemia who was apparently doing okay until yesterday when he started having chest pain in the morning.  The pain was centrally located graded as 6/10, more pressure than anything else.  Associated with some nausea and vomiting.  The patient thought it was indigestion, woke up today, and went to play some round of golf.  He was able to do that, but just continued to feel the epigastric pain more or less.  He took some Alka-Seltzer among other things, but did not get any relief, so he came to the emergency room. He has had some mild shortness of breath, but no hemoptysis.  No fever. No cough.  The pain was not radiating to any location.  It was mainly in the left chest.  PAST MEDICAL HISTORY:  Significant for: 1. Asthma. 2. Hypertension. 3. Diabetes type 2. 4. Hepatitis C. 5. Hyperlipidemia. 6. He is status post hernia repair.  ALLERGIES:  He has no known drug allergies.  CURRENT MEDICATIONS:  Include amlodipine, glimepiride, Lipitor, Micardis, Singulair, and Symbicort.  SOCIAL HISTORY:  The patient lives in [place].  He smokes cigar, about 3 cigars a day.  Social alcohol, formerly used to drink, but not any more and denied IV drug use.  FAMILY HISTORY:  Denied any family history of coronary artery disease.  REVIEW OF SYSTEMS:  Also systems reviewed are negative except per HPI.  PHYSICAL EXAMINATION:  VITAL SIGNS:  Temperature is  98.2, blood pressure 114/41, pulse 65, respiratory rate 18, sats 94% on room air. GENERAL:  He is awake, alert, oriented.  He is in no acute distress. HEENT:  PERRL.  EOMI.  No pallor.  No jaundice.  No rhinorrhea. NECK:  Supple.  No JVD.  No lymphadenopathy. RESPIRATORY:  He has good air entry bilaterally.  No wheezes.  No rales. CARDIOVASCULAR SYSTEM:  He has S1 and S2.  No audible murmurs. ABDOMEN:  Soft, full, nontender with positive bowel sounds. EXTREMITIES:  Show no edema, cyanosis, or clubbing. SKIN:  No rashes or ulcers.  LABORATORY DATA:  His white count is 12.9 with a left shift.  ANC of 8.1.  Hemoglobin is 13.7, platelet count of 315.  Total CK 278, CK-MB 4.4 with an index of 1.6.  His troponin is negative.  Lipase is 31. Sodium 140, potassium 3.6, chloride 103, CO2 25, glucose 64, BUN 15, creatinine 0.74, calcium 9.6, total protein 7.1, albumin 3.6.  The rest of the LFTs within normal.  BNP is only 31.  Chest x-ray showed no evidence of active pulmonary disease.  His EKG showed  normal sinus rhythm with no significant ST-T wave changes.  ASSESSMENT:  This is a 65 year old gentleman presenting with chest pain who also has risk factors for coronary artery disease.  Based on the patient's history, he will benefit from workup for possible acute coronary syndrome.  Per the patient, he has had previous stress test, but more than 20 years ago.  PLAN: 1. Chest pain.  We will admit the patient for observation, cycle his     enzymes.  If enzymes are negative x3, we will then consider     outpatient or inpatient stress testing. 2. Hypertension, blood pressure seems controlled.  We will resume his     home medications in the morning. 3. Diabetes.  I will put him on sliding scale insulin while in the     hospital. 4. Hyperlipidemia.  I will check fasting lipid panel and continue with     his statin. 5. History of asthma.  I will put him on empiric nebulizers in the      hospital. 6. History of hepatitis C.  This seems to be stable also. 7. Tobacco use.  We will counsel the patient accordingly.  Further treatment depends on the patient's response to these measures.     Lonia Blood, M.D.     Verlin Grills  D:  01/21/2011  T:  01/21/2011  Job:  [id number]  Electronically Signed by Lonia Blood M.D. on 02/17/2011 12:25:20 AM

## 2011-05-13 LAB — DIFFERENTIAL
Basophils Absolute: 0
Eosinophils Absolute: 0.1
Eosinophils Relative: 1
Lymphocytes Relative: 16
Neutrophils Relative %: 74

## 2011-05-13 LAB — CBC
HCT: 46.2
Platelets: 287
RDW: 15.3
WBC: 9.4

## 2011-05-13 LAB — BASIC METABOLIC PANEL
BUN: 13
Creatinine, Ser: 0.81
GFR calc non Af Amer: >60
Glucose, Bld: 142 — ABNORMAL HIGH
Potassium: 3.4 — ABNORMAL LOW

## 2011-05-13 LAB — RAPID URINE DRUG SCREEN, HOSP PERFORMED
Amphetamines: NOT DETECTED
Benzodiazepines: NOT DETECTED
Cocaine: NOT DETECTED
Opiates: NOT DETECTED
Tetrahydrocannabinol: NOT DETECTED

## 2011-05-13 LAB — ETHANOL: Alcohol, Ethyl (B): 111 — ABNORMAL HIGH

## 2011-05-14 LAB — COMPREHENSIVE METABOLIC PANEL
ALT: 47
AST: 27
Albumin: 3.5
Calcium: 9.2
Chloride: 106
Creatinine, Ser: 0.98
GFR calc Af Amer: >60
Sodium: 139

## 2011-05-14 LAB — ETHANOL: Alcohol, Ethyl (B): <5

## 2011-05-14 LAB — PROTIME-INR
INR: 1
Prothrombin Time: 13.4

## 2011-05-17 LAB — HEPATIC FUNCTION PANEL
Alkaline Phosphatase: 60
Indirect Bilirubin: 0.8
Total Bilirubin: 1
Total Protein: 6.7

## 2011-05-17 LAB — CBC
HCT: 46.5
HCT: 42.6
Hemoglobin: 15.5
MCV: 91.3
Platelets: 301
RBC: 5.23
WBC: 10.1

## 2011-05-17 LAB — RAPID URINE DRUG SCREEN, HOSP PERFORMED
Barbiturates: NOT DETECTED
Cocaine: NOT DETECTED
Opiates: NOT DETECTED

## 2011-05-17 LAB — BASIC METABOLIC PANEL
BUN: 16
CO2: 25
CO2: 28
Calcium: 9.1
Chloride: 102
GFR calc Af Amer: >60
GFR calc non Af Amer: 54 — ABNORMAL LOW
Potassium: 4.2
Potassium: 3.6
Sodium: 143

## 2011-05-17 LAB — URINALYSIS, ROUTINE W REFLEX MICROSCOPIC
Hgb urine dipstick: NEGATIVE
Ketones, ur: NEGATIVE
Nitrite: NEGATIVE
Nitrite: NEGATIVE
Protein, ur: 100 — AB
Specific Gravity, Urine: 1.016
Specific Gravity, Urine: 1.023
Urobilinogen, UA: 0.2
pH: 7.5

## 2011-05-17 LAB — URINE MICROSCOPIC-ADD ON

## 2011-05-17 LAB — APTT: aPTT: 24

## 2011-05-17 LAB — DIFFERENTIAL
Eosinophils Absolute: 0.2
Eosinophils Relative: 2
Lymphocytes Relative: 10 — ABNORMAL LOW
Lymphs Abs: 1.5
Monocytes Absolute: 1
Monocytes Relative: 6
Monocytes Relative: 8
Neutro Abs: 12.9 — ABNORMAL HIGH

## 2011-05-17 LAB — ETHANOL: Alcohol, Ethyl (B): 20 — ABNORMAL HIGH

## 2011-05-17 LAB — PROTIME-INR: Prothrombin Time: 13.2

## 2011-05-28 LAB — DIFFERENTIAL
Basophils Absolute: 0
Lymphocytes Relative: 17
Monocytes Absolute: 0.9
Monocytes Relative: 12
Neutro Abs: 5.3

## 2011-05-28 LAB — CBC
HCT: 47.8
MCHC: 34.2
MCV: 89.6
Platelets: 224
RDW: 18.8 — ABNORMAL HIGH

## 2011-05-28 LAB — COMPREHENSIVE METABOLIC PANEL
Albumin: 4
BUN: 21
Creatinine, Ser: 0.94
Total Bilirubin: 0.8
Total Protein: 7.2

## 2011-11-04 ENCOUNTER — Other Ambulatory Visit: Payer: Self-pay | Admitting: Internal Medicine

## 2011-11-04 DIAGNOSIS — K7689 Other specified diseases of liver: Secondary | ICD-10-CM

## 2011-11-05 ENCOUNTER — Ambulatory Visit
Admission: RE | Admit: 2011-11-05 | Discharge: 2011-11-05 | Disposition: A | Discharge status: Home / Self Care | Payer: Federal, State, Local not specified - PPO | Source: Ambulatory Visit | Attending: Internal Medicine | Admitting: Internal Medicine

## 2011-11-05 DIAGNOSIS — K7689 Other specified diseases of liver: Secondary | ICD-10-CM

## 2011-11-05 MED ORDER — GADOBENATE DIMEGLUMINE 529 MG/ML IV SOLN
20.0000 mL | Freq: Once | INTRAVENOUS | Status: AC | PRN
  Administered 2011-11-05: 20 mL via INTRAVENOUS

## 2011-11-07 ENCOUNTER — Other Ambulatory Visit: Payer: Federal, State, Local not specified - PPO

## 2012-10-12 ENCOUNTER — Other Ambulatory Visit: Payer: Self-pay | Admitting: Internal Medicine

## 2012-10-12 DIAGNOSIS — B1920 Unspecified viral hepatitis C without hepatic coma: Secondary | ICD-10-CM

## 2012-10-14 ENCOUNTER — Ambulatory Visit
Admission: RE | Admit: 2012-10-14 | Discharge: 2012-10-14 | Disposition: A | Discharge status: Home / Self Care | Payer: Federal, State, Local not specified - PPO | Source: Ambulatory Visit | Attending: Internal Medicine | Admitting: Internal Medicine

## 2012-10-14 DIAGNOSIS — B1920 Unspecified viral hepatitis C without hepatic coma: Secondary | ICD-10-CM

## 2012-11-09 ENCOUNTER — Other Ambulatory Visit: Payer: Self-pay | Admitting: Physician Assistant

## 2012-11-09 DIAGNOSIS — R935 Abnormal findings on diagnostic imaging of other abdominal regions, including retroperitoneum: Secondary | ICD-10-CM

## 2012-11-09 DIAGNOSIS — E8809 Other disorders of plasma-protein metabolism, not elsewhere classified: Secondary | ICD-10-CM

## 2012-11-13 ENCOUNTER — Ambulatory Visit
Admission: RE | Admit: 2012-11-13 | Discharge: 2012-11-13 | Disposition: A | Discharge status: Home / Self Care | Payer: Federal, State, Local not specified - PPO | Source: Ambulatory Visit | Attending: Physician Assistant | Admitting: Physician Assistant

## 2012-11-13 DIAGNOSIS — R935 Abnormal findings on diagnostic imaging of other abdominal regions, including retroperitoneum: Secondary | ICD-10-CM

## 2012-11-13 DIAGNOSIS — E8809 Other disorders of plasma-protein metabolism, not elsewhere classified: Secondary | ICD-10-CM

## 2012-11-13 MED ORDER — IOHEXOL 300 MG/ML  SOLN
125.0000 mL | Freq: Once | INTRAMUSCULAR | Status: AC | PRN
  Administered 2012-11-13: 125 mL via INTRAVENOUS

## 2013-08-25 ENCOUNTER — Other Ambulatory Visit: Payer: Self-pay | Admitting: Internal Medicine

## 2013-08-25 DIAGNOSIS — C220 Liver cell carcinoma: Secondary | ICD-10-CM

## 2013-08-27 ENCOUNTER — Ambulatory Visit
Admission: RE | Admit: 2013-08-27 | Discharge: 2013-08-27 | Disposition: A | Discharge status: Home / Self Care | Payer: Medicare Other | Source: Ambulatory Visit | Attending: Internal Medicine | Admitting: Internal Medicine

## 2013-08-27 DIAGNOSIS — C220 Liver cell carcinoma: Secondary | ICD-10-CM

## 2013-08-27 MED ORDER — IOHEXOL 300 MG/ML  SOLN
125.0000 mL | Freq: Once | INTRAMUSCULAR | Status: AC | PRN
  Administered 2013-08-27: 125 mL via INTRAVENOUS

## 2014-02-28 ENCOUNTER — Other Ambulatory Visit: Payer: Self-pay | Admitting: Nurse Practitioner

## 2014-02-28 DIAGNOSIS — K7460 Unspecified cirrhosis of liver: Secondary | ICD-10-CM | POA: Diagnosis not present

## 2014-02-28 DIAGNOSIS — K7689 Other specified diseases of liver: Secondary | ICD-10-CM | POA: Diagnosis not present

## 2014-02-28 DIAGNOSIS — B182 Chronic viral hepatitis C: Secondary | ICD-10-CM | POA: Diagnosis not present

## 2014-02-28 DIAGNOSIS — C220 Liver cell carcinoma: Secondary | ICD-10-CM

## 2014-03-01 ENCOUNTER — Ambulatory Visit
Admission: RE | Admit: 2014-03-01 | Discharge: 2014-03-01 | Disposition: A | Discharge status: Home / Self Care | Payer: Medicare Other | Source: Ambulatory Visit | Attending: Nurse Practitioner | Admitting: Nurse Practitioner

## 2014-03-01 DIAGNOSIS — D1803 Hemangioma of intra-abdominal structures: Secondary | ICD-10-CM | POA: Diagnosis not present

## 2014-03-01 DIAGNOSIS — N281 Cyst of kidney, acquired: Secondary | ICD-10-CM | POA: Diagnosis not present

## 2014-03-01 DIAGNOSIS — C220 Liver cell carcinoma: Secondary | ICD-10-CM

## 2014-03-01 MED ORDER — GADOXETATE DISODIUM 0.25 MMOL/ML IV SOLN
9.0000 mL | Freq: Once | INTRAVENOUS | Status: AC | PRN
  Administered 2014-03-01: 9 mL via INTRAVENOUS

## 2014-04-04 DIAGNOSIS — B182 Chronic viral hepatitis C: Secondary | ICD-10-CM | POA: Diagnosis not present

## 2014-04-05 DIAGNOSIS — J45909 Unspecified asthma, uncomplicated: Secondary | ICD-10-CM | POA: Diagnosis not present

## 2014-04-05 DIAGNOSIS — E785 Hyperlipidemia, unspecified: Secondary | ICD-10-CM | POA: Diagnosis not present

## 2014-04-05 DIAGNOSIS — E119 Type 2 diabetes mellitus without complications: Secondary | ICD-10-CM | POA: Diagnosis not present

## 2014-04-05 DIAGNOSIS — I10 Essential (primary) hypertension: Secondary | ICD-10-CM | POA: Diagnosis not present

## 2014-05-04 DIAGNOSIS — J45909 Unspecified asthma, uncomplicated: Secondary | ICD-10-CM | POA: Diagnosis not present

## 2014-06-08 DIAGNOSIS — B182 Chronic viral hepatitis C: Secondary | ICD-10-CM | POA: Diagnosis not present

## 2014-06-18 ENCOUNTER — Encounter: Payer: Self-pay | Admitting: *Deleted

## 2014-06-20 DIAGNOSIS — B182 Chronic viral hepatitis C: Secondary | ICD-10-CM | POA: Diagnosis not present

## 2014-07-05 DIAGNOSIS — E119 Type 2 diabetes mellitus without complications: Secondary | ICD-10-CM | POA: Diagnosis not present

## 2014-08-15 DIAGNOSIS — Z0000 Encounter for general adult medical examination without abnormal findings: Secondary | ICD-10-CM | POA: Diagnosis not present

## 2014-08-15 DIAGNOSIS — Z125 Encounter for screening for malignant neoplasm of prostate: Secondary | ICD-10-CM | POA: Diagnosis not present

## 2014-08-15 DIAGNOSIS — Z23 Encounter for immunization: Secondary | ICD-10-CM | POA: Diagnosis not present

## 2014-08-15 DIAGNOSIS — B1710 Acute hepatitis C without hepatic coma: Secondary | ICD-10-CM | POA: Diagnosis not present

## 2014-08-15 DIAGNOSIS — J45909 Unspecified asthma, uncomplicated: Secondary | ICD-10-CM | POA: Diagnosis not present

## 2014-08-15 DIAGNOSIS — I10 Essential (primary) hypertension: Secondary | ICD-10-CM | POA: Diagnosis not present

## 2014-08-15 DIAGNOSIS — E780 Pure hypercholesterolemia: Secondary | ICD-10-CM | POA: Diagnosis not present

## 2014-08-15 DIAGNOSIS — M179 Osteoarthritis of knee, unspecified: Secondary | ICD-10-CM | POA: Diagnosis not present

## 2014-08-15 DIAGNOSIS — E669 Obesity, unspecified: Secondary | ICD-10-CM | POA: Diagnosis not present

## 2014-08-15 DIAGNOSIS — E119 Type 2 diabetes mellitus without complications: Secondary | ICD-10-CM | POA: Diagnosis not present

## 2014-08-16 DIAGNOSIS — B182 Chronic viral hepatitis C: Secondary | ICD-10-CM | POA: Diagnosis not present

## 2014-08-29 DIAGNOSIS — M1711 Unilateral primary osteoarthritis, right knee: Secondary | ICD-10-CM | POA: Diagnosis not present

## 2014-10-05 ENCOUNTER — Other Ambulatory Visit: Payer: Self-pay | Admitting: Gastroenterology

## 2014-10-31 DIAGNOSIS — B182 Chronic viral hepatitis C: Secondary | ICD-10-CM | POA: Diagnosis not present

## 2014-11-21 DIAGNOSIS — E119 Type 2 diabetes mellitus without complications: Secondary | ICD-10-CM | POA: Diagnosis not present

## 2014-11-28 DIAGNOSIS — M1711 Unilateral primary osteoarthritis, right knee: Secondary | ICD-10-CM | POA: Diagnosis not present

## 2014-12-19 ENCOUNTER — Encounter (HOSPITAL_COMMUNITY): Payer: Self-pay | Admitting: *Deleted

## 2014-12-20 DIAGNOSIS — H02052 Trichiasis without entropian right lower eyelid: Secondary | ICD-10-CM | POA: Diagnosis not present

## 2014-12-20 DIAGNOSIS — H16011 Central corneal ulcer, right eye: Secondary | ICD-10-CM | POA: Diagnosis not present

## 2014-12-21 DIAGNOSIS — H16011 Central corneal ulcer, right eye: Secondary | ICD-10-CM | POA: Diagnosis not present

## 2014-12-23 DIAGNOSIS — H16011 Central corneal ulcer, right eye: Secondary | ICD-10-CM | POA: Diagnosis not present

## 2014-12-26 ENCOUNTER — Ambulatory Visit (HOSPITAL_COMMUNITY): Payer: Medicare Other | Admitting: Certified Registered Nurse Anesthetist

## 2014-12-26 ENCOUNTER — Ambulatory Visit (HOSPITAL_COMMUNITY)
Admission: RE | Admit: 2014-12-26 | Discharge: 2014-12-26 | Disposition: A | Discharge status: Home / Self Care | Payer: Medicare Other | Source: Ambulatory Visit | Attending: Gastroenterology | Admitting: Gastroenterology

## 2014-12-26 ENCOUNTER — Encounter (HOSPITAL_COMMUNITY): Payer: Self-pay | Admitting: Certified Registered Nurse Anesthetist

## 2014-12-26 ENCOUNTER — Encounter (HOSPITAL_COMMUNITY)
Admission: RE | Disposition: A | Discharge status: Home / Self Care | Payer: Self-pay | Source: Ambulatory Visit | Attending: Gastroenterology

## 2014-12-26 DIAGNOSIS — E119 Type 2 diabetes mellitus without complications: Secondary | ICD-10-CM | POA: Insufficient documentation

## 2014-12-26 DIAGNOSIS — Z79899 Other long term (current) drug therapy: Secondary | ICD-10-CM | POA: Insufficient documentation

## 2014-12-26 DIAGNOSIS — I10 Essential (primary) hypertension: Secondary | ICD-10-CM | POA: Diagnosis not present

## 2014-12-26 DIAGNOSIS — Z09 Encounter for follow-up examination after completed treatment for conditions other than malignant neoplasm: Secondary | ICD-10-CM | POA: Diagnosis present

## 2014-12-26 DIAGNOSIS — Z800 Family history of malignant neoplasm of digestive organs: Secondary | ICD-10-CM | POA: Diagnosis not present

## 2014-12-26 DIAGNOSIS — F1910 Other psychoactive substance abuse, uncomplicated: Secondary | ICD-10-CM | POA: Diagnosis not present

## 2014-12-26 DIAGNOSIS — M1990 Unspecified osteoarthritis, unspecified site: Secondary | ICD-10-CM | POA: Diagnosis not present

## 2014-12-26 DIAGNOSIS — J449 Chronic obstructive pulmonary disease, unspecified: Secondary | ICD-10-CM | POA: Insufficient documentation

## 2014-12-26 DIAGNOSIS — F17200 Nicotine dependence, unspecified, uncomplicated: Secondary | ICD-10-CM | POA: Insufficient documentation

## 2014-12-26 DIAGNOSIS — Z86010 Personal history of colonic polyps: Secondary | ICD-10-CM | POA: Diagnosis not present

## 2014-12-26 DIAGNOSIS — J45909 Unspecified asthma, uncomplicated: Secondary | ICD-10-CM | POA: Insufficient documentation

## 2014-12-26 DIAGNOSIS — Z1211 Encounter for screening for malignant neoplasm of colon: Secondary | ICD-10-CM | POA: Diagnosis not present

## 2014-12-26 HISTORY — PX: COLONOSCOPY WITH PROPOFOL: SHX5780

## 2014-12-26 SURGERY — COLONOSCOPY WITH PROPOFOL
Anesthesia: Monitor Anesthesia Care

## 2014-12-26 MED ORDER — ATROPINE SULFATE 0.4 MG/ML IJ SOLN
INTRAMUSCULAR | Status: AC
  Filled 2014-12-26: qty 1

## 2014-12-26 MED ORDER — LACTATED RINGERS IV SOLN
INTRAVENOUS | Status: DC | PRN
  Administered 2014-12-26 (×2): via INTRAVENOUS

## 2014-12-26 MED ORDER — PROPOFOL 10 MG/ML IV BOLUS
INTRAVENOUS | Status: AC
  Filled 2014-12-26: qty 20

## 2014-12-26 MED ORDER — EPHEDRINE SULFATE 50 MG/ML IJ SOLN
INTRAMUSCULAR | Status: AC
  Filled 2014-12-26: qty 1

## 2014-12-26 MED ORDER — PROPOFOL 10 MG/ML IV BOLUS
INTRAVENOUS | Status: DC | PRN
  Administered 2014-12-26 (×5): 40 mg via INTRAVENOUS
  Administered 2014-12-26 (×3): 20 mg via INTRAVENOUS

## 2014-12-26 SURGICAL SUPPLY — 21 items

## 2014-12-26 NOTE — Anesthesia Postprocedure Evaluation (Signed)
  Anesthesia Post-op Note  Patient: Brendan Joseph  Procedure(s) Performed: Procedure(s) (LRB): COLONOSCOPY WITH PROPOFOL (N/A)  Patient Location: PACU  Anesthesia Type: MAC  Level of Consciousness: awake and alert   Airway and Oxygen Therapy: Patient Spontanous Breathing  Post-op Pain: mild  Post-op Assessment: Post-op Vital signs reviewed, Patient's Cardiovascular Status Stable, Respiratory Function Stable, Patent Airway and No signs of Nausea or vomiting  Last Vitals:  Filed Vitals:   12/26/14 0823  BP: 174/52  Pulse: 55  Temp: 36.7 C  Resp: 12    Post-op Vital Signs: stable   Complications: No apparent anesthesia complications

## 2014-12-26 NOTE — H&P (Signed)
  Procedure: Surveillance colonoscopy. 08/27/2008 colonoscopy performed with removal of adenomatous colon polyp.  History: The patient is a 69 year old male born March 11, 1946. He is scheduled to undergo a surveillance colonoscopy today.  Past medical history: Type 2 diabetes mellitus. Hypertension. Asthma. History of alcoholism. Depression. Chronic hepatitis C treated with 12 weeks of Harvoni. Hypercholesterolemia. Family history of colon cancer. Bilateral carpal tunnel syndrome. Liver hemangioma by MRI in 2012. Left humerus fracture surgery.  Allergies: ACE inhibitors cause cough. Lipitor.  Exam: The patient is alert and lying comfortably on the endoscopy stretcher. Abdomen is soft and nontender to palpation. Lungs are clear to auscultation. Cardiac exam reveals a regular rhythm.  Plan: Proceed with surveillance colonoscopy

## 2014-12-26 NOTE — Transfer of Care (Signed)
Immediate Anesthesia Transfer of Care Note  Patient: Brendan Joseph  Procedure(s) Performed: Procedure(s): COLONOSCOPY WITH PROPOFOL (N/A)  Patient Location: PACU and Endoscopy Unit  Anesthesia Type:MAC  Level of Consciousness: awake, oriented, patient cooperative, lethargic and responds to stimulation  Airway & Oxygen Therapy: Patient Spontanous Breathing and Patient connected to face mask oxygen  Post-op Assessment: Report given to RN, Post -op Vital signs reviewed and stable and Patient moving all extremities  Post vital signs: Reviewed and stable  Last Vitals:  Filed Vitals:   12/26/14 0823  BP: 174/52  Pulse: 55  Temp: 36.7 C  Resp: 12    Complications: No apparent anesthesia complications

## 2014-12-26 NOTE — Op Note (Signed)
Procedure: Surveillance colonoscopy. 08/27/2008 colonoscopy performed with removal of adenomatous colon polyps  Endoscopist: Earle Gell  Premedication: Propofol administered by anesthesia  Procedure: The patient was placed in the left lateral decubitus position. Anal inspection and digital rectal exam were normal. The Pentax pediatric colonoscope was introduced into the rectum and advanced to the cecum. A normal-appearing appendiceal orifice was identified. A normal-appearing ileocecal valve was identified. Colonic preparation for the exam today was good. Withdrawal time was 9 minutes  Rectum. Normal. Retroflexed view of the distal rectum normal  Sigmoid colon and descending colon. Normal  Splenic flexure. Normal  Transverse colon. Normal  Hepatic flexure. Normal  Ascending colon. Normal  Cecum and ileocecal valve. Normal  Assessment: Normal surveillance colonoscopy  Recommendation: Schedule repeat surveillance colonoscopy in approximately 5 years

## 2014-12-26 NOTE — Discharge Instructions (Signed)

## 2014-12-26 NOTE — Anesthesia Preprocedure Evaluation (Signed)
Anesthesia Evaluation  Patient identified by MRN, date of birth, ID band Patient awake    Reviewed: Allergy & Precautions, NPO status , Patient's Chart, lab work & pertinent test results  Airway Mallampati: II  TM Distance: >3 FB Neck ROM: Full    Dental no notable dental hx.    Pulmonary asthma , COPD COPD inhaler, Current Smoker,  breath sounds clear to auscultation  Pulmonary exam normal       Cardiovascular hypertension, Pt. on medications Normal cardiovascular examRhythm:Regular Rate:Normal     Neuro/Psych PSYCHIATRIC DISORDERS Depression  Neuromuscular disease    GI/Hepatic negative GI ROS, (+)     substance abuse  alcohol use, Hepatitis -, C  Endo/Other  diabetes, Type 2  Renal/GU negative Renal ROS  negative genitourinary   Musculoskeletal  (+) Arthritis -,   Abdominal   Peds negative pediatric ROS (+)  Hematology negative hematology ROS (+)   Anesthesia Other Findings   Reproductive/Obstetrics negative OB ROS                             Anesthesia Physical Anesthesia Plan  ASA: III  Anesthesia Plan: MAC   Post-op Pain Management:    Induction: Intravenous  Airway Management Planned: Natural Airway  Additional Equipment:   Intra-op Plan:   Post-operative Plan:   Informed Consent: I have reviewed the patients History and Physical, chart, labs and discussed the procedure including the risks, benefits and alternatives for the proposed anesthesia with the patient or authorized representative who has indicated his/her understanding and acceptance.   Dental advisory given  Plan Discussed with: CRNA  Anesthesia Plan Comments:         Anesthesia Quick Evaluation

## 2014-12-27 ENCOUNTER — Encounter (HOSPITAL_COMMUNITY): Payer: Self-pay | Admitting: Gastroenterology

## 2014-12-28 DIAGNOSIS — H16011 Central corneal ulcer, right eye: Secondary | ICD-10-CM | POA: Diagnosis not present

## 2015-01-02 DIAGNOSIS — M1711 Unilateral primary osteoarthritis, right knee: Secondary | ICD-10-CM | POA: Diagnosis not present

## 2015-01-06 DIAGNOSIS — H16011 Central corneal ulcer, right eye: Secondary | ICD-10-CM | POA: Diagnosis not present

## 2015-01-09 DIAGNOSIS — M1711 Unilateral primary osteoarthritis, right knee: Secondary | ICD-10-CM | POA: Diagnosis not present

## 2015-01-17 DIAGNOSIS — M1711 Unilateral primary osteoarthritis, right knee: Secondary | ICD-10-CM | POA: Diagnosis not present

## 2015-01-23 DIAGNOSIS — B182 Chronic viral hepatitis C: Secondary | ICD-10-CM | POA: Diagnosis not present

## 2015-01-24 DIAGNOSIS — M1711 Unilateral primary osteoarthritis, right knee: Secondary | ICD-10-CM | POA: Diagnosis not present

## 2015-01-30 DIAGNOSIS — B182 Chronic viral hepatitis C: Secondary | ICD-10-CM | POA: Diagnosis not present

## 2015-01-31 DIAGNOSIS — M1711 Unilateral primary osteoarthritis, right knee: Secondary | ICD-10-CM | POA: Diagnosis not present

## 2015-02-15 DIAGNOSIS — E669 Obesity, unspecified: Secondary | ICD-10-CM | POA: Diagnosis not present

## 2015-02-15 DIAGNOSIS — E119 Type 2 diabetes mellitus without complications: Secondary | ICD-10-CM | POA: Diagnosis not present

## 2015-02-15 DIAGNOSIS — I10 Essential (primary) hypertension: Secondary | ICD-10-CM | POA: Diagnosis not present

## 2015-02-15 DIAGNOSIS — Z6829 Body mass index (BMI) 29.0-29.9, adult: Secondary | ICD-10-CM | POA: Diagnosis not present

## 2015-02-15 DIAGNOSIS — M179 Osteoarthritis of knee, unspecified: Secondary | ICD-10-CM | POA: Diagnosis not present

## 2015-02-15 DIAGNOSIS — J4540 Moderate persistent asthma, uncomplicated: Secondary | ICD-10-CM | POA: Diagnosis not present

## 2015-02-15 DIAGNOSIS — E1165 Type 2 diabetes mellitus with hyperglycemia: Secondary | ICD-10-CM | POA: Diagnosis not present

## 2015-03-06 DIAGNOSIS — M1711 Unilateral primary osteoarthritis, right knee: Secondary | ICD-10-CM | POA: Diagnosis not present

## 2015-06-21 DIAGNOSIS — M1711 Unilateral primary osteoarthritis, right knee: Secondary | ICD-10-CM | POA: Diagnosis not present

## 2015-08-22 DIAGNOSIS — J4520 Mild intermittent asthma, uncomplicated: Secondary | ICD-10-CM | POA: Diagnosis not present

## 2015-08-22 DIAGNOSIS — Z1389 Encounter for screening for other disorder: Secondary | ICD-10-CM | POA: Diagnosis not present

## 2015-08-22 DIAGNOSIS — Z23 Encounter for immunization: Secondary | ICD-10-CM | POA: Diagnosis not present

## 2015-08-22 DIAGNOSIS — Z6832 Body mass index (BMI) 32.0-32.9, adult: Secondary | ICD-10-CM | POA: Diagnosis not present

## 2015-08-22 DIAGNOSIS — E669 Obesity, unspecified: Secondary | ICD-10-CM | POA: Diagnosis not present

## 2015-08-22 DIAGNOSIS — L989 Disorder of the skin and subcutaneous tissue, unspecified: Secondary | ICD-10-CM | POA: Diagnosis not present

## 2015-08-22 DIAGNOSIS — Z0000 Encounter for general adult medical examination without abnormal findings: Secondary | ICD-10-CM | POA: Diagnosis not present

## 2015-08-22 DIAGNOSIS — M179 Osteoarthritis of knee, unspecified: Secondary | ICD-10-CM | POA: Diagnosis not present

## 2015-08-22 DIAGNOSIS — E7800 Pure hypercholesterolemia, unspecified: Secondary | ICD-10-CM | POA: Diagnosis not present

## 2015-08-22 DIAGNOSIS — I10 Essential (primary) hypertension: Secondary | ICD-10-CM | POA: Diagnosis not present

## 2015-08-22 DIAGNOSIS — E119 Type 2 diabetes mellitus without complications: Secondary | ICD-10-CM | POA: Diagnosis not present

## 2015-08-28 DIAGNOSIS — L570 Actinic keratosis: Secondary | ICD-10-CM | POA: Diagnosis not present

## 2015-08-28 DIAGNOSIS — D225 Melanocytic nevi of trunk: Secondary | ICD-10-CM | POA: Diagnosis not present

## 2015-08-28 DIAGNOSIS — D1801 Hemangioma of skin and subcutaneous tissue: Secondary | ICD-10-CM | POA: Diagnosis not present

## 2015-08-28 DIAGNOSIS — Z85828 Personal history of other malignant neoplasm of skin: Secondary | ICD-10-CM | POA: Diagnosis not present

## 2015-08-28 DIAGNOSIS — L821 Other seborrheic keratosis: Secondary | ICD-10-CM | POA: Diagnosis not present

## 2015-09-19 DIAGNOSIS — M1711 Unilateral primary osteoarthritis, right knee: Secondary | ICD-10-CM | POA: Diagnosis not present

## 2015-09-29 DIAGNOSIS — E119 Type 2 diabetes mellitus without complications: Secondary | ICD-10-CM | POA: Diagnosis not present

## 2015-09-29 DIAGNOSIS — H1711 Central corneal opacity, right eye: Secondary | ICD-10-CM | POA: Diagnosis not present

## 2016-01-16 ENCOUNTER — Other Ambulatory Visit: Payer: Self-pay | Admitting: Orthopedic Surgery

## 2016-01-18 ENCOUNTER — Other Ambulatory Visit (HOSPITAL_COMMUNITY): Payer: Medicare Other

## 2016-02-14 DIAGNOSIS — B182 Chronic viral hepatitis C: Secondary | ICD-10-CM | POA: Diagnosis not present

## 2016-02-15 ENCOUNTER — Encounter (HOSPITAL_COMMUNITY)
Admission: RE | Admit: 2016-02-15 | Discharge: 2016-02-15 | Disposition: A | Discharge status: Home / Self Care | Payer: Medicare Other | Source: Ambulatory Visit | Attending: Orthopedic Surgery | Admitting: Orthopedic Surgery

## 2016-02-15 ENCOUNTER — Ambulatory Visit (HOSPITAL_COMMUNITY)
Admission: RE | Admit: 2016-02-15 | Discharge: 2016-02-15 | Disposition: A | Discharge status: Home / Self Care | Payer: Medicare Other | Source: Ambulatory Visit | Attending: Orthopedic Surgery | Admitting: Orthopedic Surgery

## 2016-02-15 ENCOUNTER — Encounter (HOSPITAL_COMMUNITY): Payer: Self-pay

## 2016-02-15 DIAGNOSIS — Z01812 Encounter for preprocedural laboratory examination: Secondary | ICD-10-CM | POA: Insufficient documentation

## 2016-02-15 DIAGNOSIS — Z01818 Encounter for other preprocedural examination: Secondary | ICD-10-CM | POA: Insufficient documentation

## 2016-02-15 DIAGNOSIS — Z01810 Encounter for preprocedural cardiovascular examination: Secondary | ICD-10-CM | POA: Insufficient documentation

## 2016-02-15 DIAGNOSIS — Z79899 Other long term (current) drug therapy: Secondary | ICD-10-CM | POA: Insufficient documentation

## 2016-02-15 DIAGNOSIS — Z7982 Long term (current) use of aspirin: Secondary | ICD-10-CM | POA: Insufficient documentation

## 2016-02-15 HISTORY — DX: Personal history of colon polyps, unspecified: Z86.0100

## 2016-02-15 HISTORY — DX: Pain in unspecified joint: M25.50

## 2016-02-15 HISTORY — DX: Gastro-esophageal reflux disease without esophagitis: K21.9

## 2016-02-15 HISTORY — DX: Effusion, unspecified joint: M25.40

## 2016-02-15 HISTORY — DX: Personal history of colonic polyps: Z86.010

## 2016-02-15 LAB — TYPE AND SCREEN
ABO/RH(D): O POS
Antibody Screen: NEGATIVE

## 2016-02-15 LAB — URINALYSIS, ROUTINE W REFLEX MICROSCOPIC
Bilirubin Urine: NEGATIVE
Glucose, UA: NEGATIVE mg/dL
Ketones, ur: NEGATIVE mg/dL
LEUKOCYTES UA: NEGATIVE
NITRITE: NEGATIVE
PH: 6 (ref 5.0–8.0)
Protein, ur: NEGATIVE mg/dL
SPECIFIC GRAVITY, URINE: 1.012 (ref 1.005–1.030)

## 2016-02-15 LAB — APTT: APTT: 30 s (ref 24–37)

## 2016-02-15 LAB — BASIC METABOLIC PANEL
Anion gap: 6 (ref 5–15)
BUN: 13 mg/dL (ref 6–20)
CALCIUM: 9.8 mg/dL (ref 8.9–10.3)
CO2: 27 mmol/L (ref 22–32)
Chloride: 108 mmol/L (ref 101–111)
Creatinine, Ser: 0.84 mg/dL (ref 0.61–1.24)
GFR calc Af Amer: >60 mL/min (ref >60)
GLUCOSE: 70 mg/dL (ref 65–99)
Potassium: 3.8 mmol/L (ref 3.5–5.1)
Sodium: 141 mmol/L (ref 135–145)

## 2016-02-15 LAB — URINE MICROSCOPIC-ADD ON
BACTERIA UA: NONE SEEN
SQUAMOUS EPITHELIAL / LPF: NONE SEEN
WBC UA: NONE SEEN WBC/hpf (ref 0–5)

## 2016-02-15 LAB — CBC WITH DIFFERENTIAL/PLATELET
Basophils Absolute: 0 10*3/uL (ref 0.0–0.1)
Basophils Relative: 0 %
EOS ABS: 0.2 10*3/uL (ref 0.0–0.7)
EOS PCT: 2 %
HCT: 46.3 % (ref 39.0–52.0)
Hemoglobin: 15.1 g/dL (ref 13.0–17.0)
LYMPHS ABS: 2.4 10*3/uL (ref 0.7–4.0)
Lymphocytes Relative: 23 %
MCH: 30.1 pg (ref 26.0–34.0)
MCHC: 32.6 g/dL (ref 30.0–36.0)
MCV: 92.2 fL (ref 78.0–100.0)
MONO ABS: 0.9 10*3/uL (ref 0.1–1.0)
MONOS PCT: 8 %
Neutro Abs: 6.7 10*3/uL (ref 1.7–7.7)
Neutrophils Relative %: 67 %
PLATELETS: 269 10*3/uL (ref 150–400)
RBC: 5.02 MIL/uL (ref 4.22–5.81)
RDW: 15.3 % (ref 11.5–15.5)
WBC: 10.2 10*3/uL (ref 4.0–10.5)

## 2016-02-15 LAB — SURGICAL PCR SCREEN
MRSA, PCR: NEGATIVE
STAPHYLOCOCCUS AUREUS: NEGATIVE

## 2016-02-15 LAB — ABO/RH: ABO/RH(D): O POS

## 2016-02-15 LAB — PROTIME-INR
INR: 1.02 (ref 0.00–1.49)
PROTHROMBIN TIME: 13.6 s (ref 11.6–15.2)

## 2016-02-15 LAB — GLUCOSE, CAPILLARY: GLUCOSE-CAPILLARY: 107 mg/dL — AB (ref 65–99)

## 2016-02-15 MED ORDER — CHLORHEXIDINE GLUCONATE 4 % EX LIQD: 60.0000 mL | Freq: Once | CUTANEOUS | Status: DC

## 2016-02-15 NOTE — Progress Notes (Addendum)
Cardiologist denies  Medical Md is Dr.John Laurann [place]  Echo denies  Stress test 20+ yrs ago,was done as part of annual physical  Heart cath denies  EKG denies in past yr  CXR denies in past yr

## 2016-02-15 NOTE — Pre-Procedure Instructions (Signed)
Brendan Joseph  02/15/2016      [place], [address] [place] [address] Phone: [phone] Fax: [phone]    Your procedure is scheduled on Wed, July 12 @ 8:30 AM  Report to [facility] Admitting at 6:30 AM  Call this number if you have problems the morning of surgery:  [phone]   Remember:  Do not eat food or drink liquids after midnight.  Take these medicines the morning of surgery with A SIP OF WATER Albuterol<Bring Your Inhaler With You>,Amlodipine(Norvasc),and Symbicort.            No Goody's,BC's,Aleve,Advil,Motrin,Ibuprofen,Fish Oil,or any Herbal Medications.   Do not wear jewelry.  Do not wear lotions, powders, or colognes.              Men may shave face and neck.  Do not bring valuables to the hospital.  [facility] is not responsible for any belongings or valuables.  Contacts, dentures or bridgework may not be worn into surgery.  Leave your suitcase in the car.  After surgery it may be brought to your room.  For patients admitted to the hospital, discharge time will be determined by your treatment team.  Patients discharged the day of surgery will not be allowed to drive home.    [facility]  Before surgery, you can play an important role.  Because skin is not sterile, your skin needs to be as free of germs as possible.  You can reduce the number of germs on you skin by washing with CHG (chlorahexidine gluconate) soap before surgery.  CHG is an antiseptic cleaner which kills germs and bonds with the skin to continue killing germs even after washing.  Please DO NOT use if you have an allergy to CHG or antibacterial soaps.  If your skin becomes reddened/irritated stop using the CHG and inform your nurse when you arrive at Short Stay.  Do not shave (including legs and underarms) for at least 48 hours prior to the first CHG  shower.  You may shave your face.  Please follow these instructions carefully:   1.  Shower with CHG Soap the night before surgery and the                                morning of Surgery.  2.  If you choose to wash your hair, wash your hair first as usual with your       normal shampoo.  3.  After you shampoo, rinse your hair and body thoroughly to remove the                      Shampoo.  4.  Use CHG as you would any other liquid soap.  You can apply chg directly       to the skin and wash gently with scrungie or a clean washcloth.  5.  Apply the CHG Soap to your body ONLY FROM THE NECK DOWN.        Do not use on open wounds or open sores.  Avoid contact with your eyes,       ears, mouth and genitals (private parts).  Wash genitals (private parts)       with your normal soap.  6.  Wash thoroughly, paying special attention to the area  where your surgery        will be performed.  7.  Thoroughly rinse your body with warm water from the neck down.  8.  DO NOT shower/wash with your normal soap after using and rinsing off       the CHG Soap.  9.  Pat yourself dry with a clean towel.            10.  Wear clean pajamas.            11.  Place clean sheets on your bed the night of your first shower and do not        sleep with pets.  Day of Surgery  Do not apply any lotions/deoderants the morning of surgery.  Please wear clean clothes to the hospital/surgery center.    Please read over the following fact sheets that you were given. Pain Booklet, Coughing and Deep Breathing, MRSA Information and Surgical Site Infection Prevention

## 2016-02-16 LAB — HEMOGLOBIN A1C
Hgb A1c MFr Bld: 5.9 % — ABNORMAL HIGH (ref 4.8–5.6)
MEAN PLASMA GLUCOSE: 123 mg/dL

## 2016-02-19 DIAGNOSIS — E7800 Pure hypercholesterolemia, unspecified: Secondary | ICD-10-CM | POA: Diagnosis not present

## 2016-02-19 DIAGNOSIS — J4540 Moderate persistent asthma, uncomplicated: Secondary | ICD-10-CM | POA: Diagnosis not present

## 2016-02-19 DIAGNOSIS — I10 Essential (primary) hypertension: Secondary | ICD-10-CM | POA: Diagnosis not present

## 2016-02-19 DIAGNOSIS — E119 Type 2 diabetes mellitus without complications: Secondary | ICD-10-CM | POA: Diagnosis not present

## 2016-02-21 DIAGNOSIS — B182 Chronic viral hepatitis C: Secondary | ICD-10-CM | POA: Diagnosis not present

## 2016-02-21 DIAGNOSIS — Z8619 Personal history of other infectious and parasitic diseases: Secondary | ICD-10-CM | POA: Diagnosis not present

## 2016-02-23 DIAGNOSIS — D485 Neoplasm of uncertain behavior of skin: Secondary | ICD-10-CM | POA: Diagnosis not present

## 2016-02-23 DIAGNOSIS — L570 Actinic keratosis: Secondary | ICD-10-CM | POA: Diagnosis not present

## 2016-02-23 DIAGNOSIS — D2361 Other benign neoplasm of skin of right upper limb, including shoulder: Secondary | ICD-10-CM | POA: Diagnosis not present

## 2016-02-23 DIAGNOSIS — Z85828 Personal history of other malignant neoplasm of skin: Secondary | ICD-10-CM | POA: Diagnosis not present

## 2016-02-23 DIAGNOSIS — D225 Melanocytic nevi of trunk: Secondary | ICD-10-CM | POA: Diagnosis not present

## 2016-02-23 DIAGNOSIS — D2372 Other benign neoplasm of skin of left lower limb, including hip: Secondary | ICD-10-CM | POA: Diagnosis not present

## 2016-02-23 DIAGNOSIS — L821 Other seborrheic keratosis: Secondary | ICD-10-CM | POA: Diagnosis not present

## 2016-02-27 DIAGNOSIS — M1711 Unilateral primary osteoarthritis, right knee: Secondary | ICD-10-CM

## 2016-02-27 MED ORDER — DEXTROSE-NACL 5-0.45 % IV SOLN: INTRAVENOUS | Status: DC

## 2016-02-27 MED ORDER — DEXTROSE 5 % IV SOLN
3.0000 g | INTRAVENOUS | Status: AC
  Administered 2016-02-28: 2 g via INTRAVENOUS
  Filled 2016-02-27: qty 3000

## 2016-02-27 NOTE — H&P (Signed)
TOTAL KNEE ADMISSION H&P  Patient is being admitted for right total knee arthroplasty.  Subjective:  Chief Complaint:right knee pain.  HPI: Brendan Joseph, 70 y.o. male, has a history of pain and functional disability in the right knee due to arthritis and has failed non-surgical conservative treatments for greater than 12 weeks to includeNSAID's and/or analgesics, corticosteriod injections, viscosupplementation injections and activity modification.  Onset of symptoms was gradual, starting 2 years ago with gradually worsening course since that time. The patient noted no past surgery on the right knee(s).  Patient currently rates pain in the right knee(s) at 10 out of 10 with activity. Patient has night pain, worsening of pain with activity and weight bearing, pain that interferes with activities of daily living, pain with passive range of motion, crepitus and joint swelling.  Patient has evidence of subchondral sclerosis, periarticular osteophytes and joint space narrowing by imaging studies.  There is no active infection.  There are no active problems to display for this patient.  Past Medical History  Diagnosis Date  . Depression   . Hyperlipidemia     takes Crestor   . Bilateral carpal tunnel syndrome   . Chronic hepatitis C ([place])     tx. with meds-now showing no enzyme elevation  . Right knee DJD     DJD- right knee cortisone shot 1 month ago  . Hypertension     takes Micardis and Amlodipine daily  . Asthma, persistent     Albuterol as needed.Symbicort daily  . History of colon polyps     benign  . Family hx of colon cancer   . Joint pain   . Joint swelling   . GERD (gastroesophageal reflux disease)     occasionally and will take OTC med if needs  . Diabetes mellitus, type II ([place])     controlled by diet and weight loss    Past Surgical History  Procedure Laterality Date  . Left arm fracture  april 2005    with titanium plate  . Left humerus fracture  2009  .  Colonoscopy with propofol N/A 12/26/2014    Procedure: COLONOSCOPY WITH PROPOFOL;  Surgeon: Garlan Fair, MD;  Location: [facility] END[facility]COPY;  Service: Endoscopy;  Laterality: N/A;  . Hernia repair  [id number] yrs ago    umbilical    No prescriptions prior to admission   Allergies  Allergen Reactions  . Statins     Leg cramps    Social History  Substance Use Topics  . Smoking status: Current Every Day Smoker    Types: Cigars  . Smokeless tobacco: Not on file     Comment: 2 per day  . Alcohol Use: Yes     Comment: occ beer    Family History  Problem Relation Age of Onset  . Colon cancer Mother   . Pancreatic cancer Father   . Liver cancer Paternal Grandmother      Review of Systems  Constitutional: Negative.   HENT: Negative.   Eyes:       Contacts  Respiratory:       Asthma  Cardiovascular:       HTN  Gastrointestinal:       HEP C  Genitourinary: Negative.   Musculoskeletal: Positive for myalgias and joint pain.  Skin: Negative.   Neurological: Negative.   Endo/Heme/Allergies: Negative.   Psychiatric/Behavioral: Negative.     Objective:  Physical Exam  Constitutional: He is oriented to person, place, and time. He appears well-developed and well-nourished.  HENT:  Head: Normocephalic and atraumatic.  Eyes: Pupils are equal, round, and reactive to light.  Neck: Normal range of motion. Neck supple.  Cardiovascular: Intact distal pulses.   Respiratory: Effort normal.  Musculoskeletal:  Right knee has a 5 flexion contracture, flexes 210 he walks with a profoundly antalgic gait and has trouble bearing full weight on the right lower extremity.  Very tender along the medial joint line and parapatellar retinacula.  Quad and hamstring strength is excellent.  Skin is intact he is neurovascularly intact distally, has normal pulses.  Neurological: He is alert and oriented to person, place, and time.  Skin: Skin is warm and dry.  Psychiatric: He has a normal mood and affect. His  behavior is normal. Judgment and thought content normal.    Vital signs in last 24 hours:    Labs:   Estimated body mass index is 28.90 kg/(m^2) as calculated from the following:   Height as of 12/26/14: 5\' 8"  (1.727 m).   Weight as of 12/26/14: 86.183 kg (190 lb).   Imaging Review Plain radiographs demonstrate AP, Rosenberg, lateral and sunrise x-rays of the right knee show bone-on-bone arthritis with early erosion of the tibial plateau, right knee there are also some osteophytes on the patella.    Assessment/Plan:  End stage arthritis, right knee   The patient history, physical examination, clinical judgment of the provider and imaging studies are consistent with end stage degenerative joint disease of the right knee(s) and total knee arthroplasty is deemed medically necessary. The treatment options including medical management, injection therapy arthroscopy and arthroplasty were discussed at length. The risks and benefits of total knee arthroplasty were presented and reviewed. The risks due to aseptic loosening, infection, stiffness, patella tracking problems, thromboembolic complications and other imponderables were discussed. The patient acknowledged the explanation, agreed to proceed with the plan and consent was signed. Patient is being admitted for inpatient treatment for surgery, pain control, PT, OT, prophylactic antibiotics, VTE prophylaxis, progressive ambulation and ADL's and discharge planning. The patient is planning to be discharged home with home health services

## 2016-02-28 ENCOUNTER — Encounter (HOSPITAL_COMMUNITY): Payer: Self-pay | Admitting: Anesthesiology

## 2016-02-28 ENCOUNTER — Inpatient Hospital Stay (HOSPITAL_COMMUNITY): Payer: Medicare Other | Admitting: Anesthesiology

## 2016-02-28 ENCOUNTER — Encounter (HOSPITAL_COMMUNITY)
Admission: RE | Disposition: A | Discharge status: Skilled Nursing Facility | Payer: Self-pay | Source: Ambulatory Visit | Attending: Orthopedic Surgery

## 2016-02-28 ENCOUNTER — Inpatient Hospital Stay (HOSPITAL_COMMUNITY)
Admission: RE | Admit: 2016-02-28 | Discharge: 2016-03-01 | DRG: 470 | Disposition: A | Discharge status: Skilled Nursing Facility | Payer: Medicare Other | Source: Ambulatory Visit | Attending: Orthopedic Surgery | Admitting: Orthopedic Surgery

## 2016-02-28 DIAGNOSIS — F329 Major depressive disorder, single episode, unspecified: Secondary | ICD-10-CM | POA: Diagnosis present

## 2016-02-28 DIAGNOSIS — B182 Chronic viral hepatitis C: Secondary | ICD-10-CM | POA: Diagnosis present

## 2016-02-28 DIAGNOSIS — K210 Gastro-esophageal reflux disease with esophagitis: Secondary | ICD-10-CM | POA: Diagnosis not present

## 2016-02-28 DIAGNOSIS — K219 Gastro-esophageal reflux disease without esophagitis: Secondary | ICD-10-CM | POA: Diagnosis present

## 2016-02-28 DIAGNOSIS — D62 Acute posthemorrhagic anemia: Secondary | ICD-10-CM | POA: Diagnosis not present

## 2016-02-28 DIAGNOSIS — M179 Osteoarthritis of knee, unspecified: Secondary | ICD-10-CM | POA: Diagnosis not present

## 2016-02-28 DIAGNOSIS — M1711 Unilateral primary osteoarthritis, right knee: Principal | ICD-10-CM | POA: Diagnosis present

## 2016-02-28 DIAGNOSIS — E119 Type 2 diabetes mellitus without complications: Secondary | ICD-10-CM | POA: Diagnosis present

## 2016-02-28 DIAGNOSIS — M25561 Pain in right knee: Secondary | ICD-10-CM | POA: Diagnosis not present

## 2016-02-28 DIAGNOSIS — Z471 Aftercare following joint replacement surgery: Secondary | ICD-10-CM | POA: Diagnosis not present

## 2016-02-28 DIAGNOSIS — E785 Hyperlipidemia, unspecified: Secondary | ICD-10-CM | POA: Diagnosis present

## 2016-02-28 DIAGNOSIS — I10 Essential (primary) hypertension: Secondary | ICD-10-CM | POA: Diagnosis present

## 2016-02-28 DIAGNOSIS — J45909 Unspecified asthma, uncomplicated: Secondary | ICD-10-CM | POA: Diagnosis present

## 2016-02-28 DIAGNOSIS — G5601 Carpal tunnel syndrome, right upper limb: Secondary | ICD-10-CM | POA: Diagnosis not present

## 2016-02-28 DIAGNOSIS — F17290 Nicotine dependence, other tobacco product, uncomplicated: Secondary | ICD-10-CM | POA: Diagnosis present

## 2016-02-28 DIAGNOSIS — M25569 Pain in unspecified knee: Secondary | ICD-10-CM | POA: Diagnosis not present

## 2016-02-28 DIAGNOSIS — M6281 Muscle weakness (generalized): Secondary | ICD-10-CM | POA: Diagnosis not present

## 2016-02-28 DIAGNOSIS — R278 Other lack of coordination: Secondary | ICD-10-CM | POA: Diagnosis not present

## 2016-02-28 DIAGNOSIS — G5602 Carpal tunnel syndrome, left upper limb: Secondary | ICD-10-CM | POA: Diagnosis not present

## 2016-02-28 DIAGNOSIS — F330 Major depressive disorder, recurrent, mild: Secondary | ICD-10-CM | POA: Diagnosis not present

## 2016-02-28 DIAGNOSIS — R2689 Other abnormalities of gait and mobility: Secondary | ICD-10-CM | POA: Diagnosis not present

## 2016-02-28 HISTORY — PX: TOTAL KNEE ARTHROPLASTY: SHX125

## 2016-02-28 LAB — GLUCOSE, CAPILLARY
Glucose-Capillary: 118 mg/dL — ABNORMAL HIGH (ref 65–99)
Glucose-Capillary: 98 mg/dL (ref 65–99)

## 2016-02-28 SURGERY — ARTHROPLASTY, KNEE, TOTAL
Anesthesia: Spinal | Site: Knee | Laterality: Right

## 2016-02-28 MED ORDER — ACETAMINOPHEN 650 MG RE SUPP: 650.0000 mg | Freq: Four times a day (QID) | RECTAL | Status: DC | PRN

## 2016-02-28 MED ORDER — FENTANYL CITRATE (PF) 250 MCG/5ML IJ SOLN
INTRAMUSCULAR | Status: AC
  Filled 2016-02-28: qty 5

## 2016-02-28 MED ORDER — MEPERIDINE HCL 25 MG/ML IJ SOLN: 6.2500 mg | INTRAMUSCULAR | Status: DC | PRN

## 2016-02-28 MED ORDER — DOCUSATE SODIUM 100 MG PO CAPS
100.0000 mg | ORAL_CAPSULE | Freq: Two times a day (BID) | ORAL | Status: DC
  Administered 2016-02-28 – 2016-03-01 (×5): 100 mg via ORAL
  Filled 2016-02-28 (×5): qty 1

## 2016-02-28 MED ORDER — METOCLOPRAMIDE HCL 5 MG PO TABS: 5.0000 mg | ORAL_TABLET | Freq: Three times a day (TID) | ORAL | Status: DC | PRN

## 2016-02-28 MED ORDER — MIDAZOLAM HCL 5 MG/5ML IJ SOLN
INTRAMUSCULAR | Status: DC | PRN
  Administered 2016-02-28: 2 mg via INTRAVENOUS

## 2016-02-28 MED ORDER — DIPHENHYDRAMINE HCL 12.5 MG/5ML PO ELIX
12.5000 mg | ORAL_SOLUTION | ORAL | Status: DC | PRN
Start: 2016-02-28 — End: 2016-03-01

## 2016-02-28 MED ORDER — HYDROMORPHONE HCL 1 MG/ML IJ SOLN
0.5000 mg | INTRAMUSCULAR | Status: DC | PRN
  Administered 2016-02-28 – 2016-02-29 (×5): 1 mg via INTRAVENOUS
  Filled 2016-02-28 (×6): qty 1

## 2016-02-28 MED ORDER — KETOROLAC TROMETHAMINE 30 MG/ML IJ SOLN
30.0000 mg | Freq: Once | INTRAMUSCULAR | Status: AC
  Administered 2016-02-28: 30 mg via INTRAVENOUS

## 2016-02-28 MED ORDER — PROPOFOL 500 MG/50ML IV EMUL
INTRAVENOUS | Status: DC | PRN
  Administered 2016-02-28: 75 ug/kg/min via INTRAVENOUS
  Administered 2016-02-28: 10:00:00 via INTRAVENOUS

## 2016-02-28 MED ORDER — SENNOSIDES-DOCUSATE SODIUM 8.6-50 MG PO TABS: 1.0000 | ORAL_TABLET | Freq: Every evening | ORAL | Status: DC | PRN

## 2016-02-28 MED ORDER — MIDAZOLAM HCL 2 MG/2ML IJ SOLN
INTRAMUSCULAR | Status: AC
  Filled 2016-02-28: qty 2

## 2016-02-28 MED ORDER — AMLODIPINE BESYLATE 5 MG PO TABS
5.0000 mg | ORAL_TABLET | Freq: Every morning | ORAL | Status: DC
  Administered 2016-02-28 – 2016-02-29 (×2): 5 mg via ORAL
  Filled 2016-02-28 (×3): qty 1

## 2016-02-28 MED ORDER — ONDANSETRON HCL 4 MG/2ML IJ SOLN
INTRAMUSCULAR | Status: AC
  Filled 2016-02-28: qty 2

## 2016-02-28 MED ORDER — ACETAMINOPHEN 325 MG PO TABS
650.0000 mg | ORAL_TABLET | Freq: Four times a day (QID) | ORAL | Status: DC | PRN
  Administered 2016-02-29 (×2): 650 mg via ORAL
  Filled 2016-02-28 (×2): qty 2

## 2016-02-28 MED ORDER — BISACODYL 5 MG PO TBEC
5.0000 mg | DELAYED_RELEASE_TABLET | Freq: Every day | ORAL | Status: DC | PRN
Start: 2016-02-28 — End: 2016-03-01

## 2016-02-28 MED ORDER — ALUM & MAG HYDROXIDE-SIMETH 200-200-20 MG/5ML PO SUSP: 30.0000 mL | ORAL | Status: DC | PRN

## 2016-02-28 MED ORDER — SODIUM CHLORIDE 0.9 % IJ SOLN
INTRAMUSCULAR | Status: DC | PRN
  Administered 2016-02-28: 50 mL

## 2016-02-28 MED ORDER — METHOCARBAMOL 1000 MG/10ML IJ SOLN
500.0000 mg | Freq: Four times a day (QID) | INTRAVENOUS | Status: DC | PRN
  Filled 2016-02-28: qty 5

## 2016-02-28 MED ORDER — CEFUROXIME SODIUM 1.5 G IJ SOLR
INTRAMUSCULAR | Status: AC
  Filled 2016-02-28: qty 1.5

## 2016-02-28 MED ORDER — PROPOFOL 10 MG/ML IV BOLUS
INTRAVENOUS | Status: DC | PRN
  Administered 2016-02-28 (×3): 20 mg via INTRAVENOUS

## 2016-02-28 MED ORDER — SODIUM CHLORIDE 0.9 % IV SOLN
10.0000 mg | INTRAVENOUS | Status: DC | PRN
  Administered 2016-02-28: 15 ug/min via INTRAVENOUS

## 2016-02-28 MED ORDER — METHOCARBAMOL 500 MG PO TABS
500.0000 mg | ORAL_TABLET | Freq: Four times a day (QID) | ORAL | Status: DC | PRN
  Administered 2016-02-28 – 2016-03-01 (×5): 500 mg via ORAL
  Filled 2016-02-28 (×5): qty 1

## 2016-02-28 MED ORDER — MENTHOL 3 MG MT LOZG: 1.0000 | LOZENGE | OROMUCOSAL | Status: DC | PRN

## 2016-02-28 MED ORDER — TRANEXAMIC ACID 1000 MG/10ML IV SOLN
1000.0000 mg | Freq: Once | INTRAVENOUS | Status: AC
  Administered 2016-02-28: 1000 mg via INTRAVENOUS
  Filled 2016-02-28: qty 10

## 2016-02-28 MED ORDER — CEFUROXIME SODIUM 1.5 G IJ SOLR
INTRAMUSCULAR | Status: DC | PRN
Start: 2016-02-28 — End: 2016-02-28
  Administered 2016-02-28: 1.5 g

## 2016-02-28 MED ORDER — LIDOCAINE HCL (CARDIAC) 20 MG/ML IV SOLN
INTRAVENOUS | Status: DC | PRN
  Administered 2016-02-28: 30 mg via INTRATRACHEAL

## 2016-02-28 MED ORDER — BUPIVACAINE IN DEXTROSE 0.75-8.25 % IT SOLN
INTRATHECAL | Status: DC | PRN
  Administered 2016-02-28: 1.8 mL via INTRATHECAL

## 2016-02-28 MED ORDER — ASPIRIN EC 325 MG PO TBEC
325.0000 mg | DELAYED_RELEASE_TABLET | Freq: Every day | ORAL | Status: DC
  Administered 2016-02-29 – 2016-03-01 (×2): 325 mg via ORAL
  Filled 2016-02-28 (×2): qty 1

## 2016-02-28 MED ORDER — IRBESARTAN 300 MG PO TABS
300.0000 mg | ORAL_TABLET | Freq: Every day | ORAL | Status: DC
  Administered 2016-02-28 – 2016-03-01 (×3): 300 mg via ORAL
  Filled 2016-02-28 (×3): qty 1

## 2016-02-28 MED ORDER — KCL IN DEXTROSE-NACL 20-5-0.45 MEQ/L-%-% IV SOLN
INTRAVENOUS | Status: DC
  Administered 2016-02-28 – 2016-02-29 (×2): via INTRAVENOUS
  Filled 2016-02-28 (×2): qty 1000

## 2016-02-28 MED ORDER — ALBUTEROL SULFATE (2.5 MG/3ML) 0.083% IN NEBU: 2.5000 mg | INHALATION_SOLUTION | Freq: Four times a day (QID) | RESPIRATORY_TRACT | Status: DC | PRN

## 2016-02-28 MED ORDER — BUPIVACAINE-EPINEPHRINE (PF) 0.25% -1:200000 IJ SOLN
INTRAMUSCULAR | Status: DC | PRN
  Administered 2016-02-28: 50 mL

## 2016-02-28 MED ORDER — MOMETASONE FURO-FORMOTEROL FUM 200-5 MCG/ACT IN AERO
2.0000 | INHALATION_SPRAY | Freq: Two times a day (BID) | RESPIRATORY_TRACT | Status: DC
  Administered 2016-02-28 – 2016-03-01 (×4): 2 via RESPIRATORY_TRACT
  Filled 2016-02-28: qty 8.8

## 2016-02-28 MED ORDER — METOCLOPRAMIDE HCL 5 MG/ML IJ SOLN: 5.0000 mg | Freq: Three times a day (TID) | INTRAMUSCULAR | Status: DC | PRN

## 2016-02-28 MED ORDER — BUPIVACAINE LIPOSOME 1.3 % IJ SUSP
INTRAMUSCULAR | Status: DC | PRN
  Administered 2016-02-28: 20 mL

## 2016-02-28 MED ORDER — ONDANSETRON HCL 4 MG/2ML IJ SOLN: 4.0000 mg | Freq: Four times a day (QID) | INTRAMUSCULAR | Status: DC | PRN

## 2016-02-28 MED ORDER — OXYCODONE HCL 5 MG PO TABS
5.0000 mg | ORAL_TABLET | ORAL | Status: DC | PRN
  Administered 2016-02-28 – 2016-03-01 (×12): 10 mg via ORAL
  Filled 2016-02-28 (×12): qty 2

## 2016-02-28 MED ORDER — BUPIVACAINE-EPINEPHRINE (PF) 0.25% -1:200000 IJ SOLN
INTRAMUSCULAR | Status: AC
  Filled 2016-02-28: qty 60

## 2016-02-28 MED ORDER — ROCURONIUM BROMIDE 50 MG/5ML IV SOLN
INTRAVENOUS | Status: AC
  Filled 2016-02-28: qty 1

## 2016-02-28 MED ORDER — KETOROLAC TROMETHAMINE 30 MG/ML IJ SOLN
INTRAMUSCULAR | Status: AC
  Filled 2016-02-28: qty 1

## 2016-02-28 MED ORDER — LACTATED RINGERS IV SOLN
INTRAVENOUS | Status: DC | PRN
  Administered 2016-02-28 (×2): via INTRAVENOUS

## 2016-02-28 MED ORDER — TRANEXAMIC ACID 1000 MG/10ML IV SOLN
2000.0000 mg | Freq: Once | INTRAVENOUS | Status: AC
  Administered 2016-02-28: 2000 mg via TOPICAL
  Filled 2016-02-28: qty 20

## 2016-02-28 MED ORDER — PHENOL 1.4 % MT LIQD: 1.0000 | OROMUCOSAL | Status: DC | PRN

## 2016-02-28 MED ORDER — HYDROMORPHONE HCL 1 MG/ML IJ SOLN
0.2500 mg | INTRAMUSCULAR | Status: DC | PRN
  Administered 2016-02-28 (×2): 0.5 mg via INTRAVENOUS

## 2016-02-28 MED ORDER — FENTANYL CITRATE (PF) 100 MCG/2ML IJ SOLN
INTRAMUSCULAR | Status: DC | PRN
  Administered 2016-02-28: 50 ug via INTRAVENOUS

## 2016-02-28 MED ORDER — SODIUM CHLORIDE 0.9 % IR SOLN
Status: DC | PRN
  Administered 2016-02-28: 1000 mL

## 2016-02-28 MED ORDER — PROPOFOL 10 MG/ML IV BOLUS
INTRAVENOUS | Status: AC
  Filled 2016-02-28: qty 20

## 2016-02-28 MED ORDER — PROMETHAZINE HCL 25 MG/ML IJ SOLN: 6.2500 mg | INTRAMUSCULAR | Status: DC | PRN

## 2016-02-28 MED ORDER — ONDANSETRON HCL 4 MG PO TABS: 4.0000 mg | ORAL_TABLET | Freq: Four times a day (QID) | ORAL | Status: DC | PRN

## 2016-02-28 MED ORDER — FLEET ENEMA 7-19 GM/118ML RE ENEM: 1.0000 | ENEMA | Freq: Once | RECTAL | Status: DC | PRN

## 2016-02-28 MED ORDER — BUPIVACAINE LIPOSOME 1.3 % IJ SUSP
20.0000 mL | Freq: Once | INTRAMUSCULAR | Status: DC
  Filled 2016-02-28: qty 20

## 2016-02-28 MED ORDER — HYDROMORPHONE HCL 1 MG/ML IJ SOLN
INTRAMUSCULAR | Status: AC
  Filled 2016-02-28: qty 1

## 2016-02-28 MED ORDER — SODIUM CHLORIDE 0.9 % IV SOLN
1000.0000 mg | INTRAVENOUS | Status: AC
  Administered 2016-02-28: 1000 mg via INTRAVENOUS
  Filled 2016-02-28: qty 10

## 2016-02-28 SURGICAL SUPPLY — 56 items
BANDAGE ACE 6X5 VEL STRL LF (GAUZE/BANDAGES/DRESSINGS) ×3 IMPLANT
BANDAGE ESMARK 6X9 LF (GAUZE/BANDAGES/DRESSINGS) ×1 IMPLANT
BLADE SAG 18X100X1.27 (BLADE) ×3 IMPLANT
BLADE SAW SGTL 13X75X1.27 (BLADE) ×3 IMPLANT
BLADE SURG ROTATE 9660 (MISCELLANEOUS) IMPLANT
BNDG CMPR 9X6 STRL LF SNTH (GAUZE/BANDAGES/DRESSINGS) ×1
BNDG CMPR MED 10X6 ELC LF (GAUZE/BANDAGES/DRESSINGS) ×1
BNDG ELASTIC 6X10 VLCR STRL LF (GAUZE/BANDAGES/DRESSINGS) ×3 IMPLANT
BNDG ESMARK 6X9 LF (GAUZE/BANDAGES/DRESSINGS) ×3
BOWL SMART MIX CTS (DISPOSABLE) ×3 IMPLANT
CAPT KNEE TOTAL 3 ATTUNE ×3 IMPLANT
CEMENT HV SMART SET (Cement) ×6 IMPLANT
COVER SURGICAL LIGHT HANDLE (MISCELLANEOUS) ×3 IMPLANT
CUFF TOURNIQUET SINGLE 34IN LL (TOURNIQUET CUFF) ×3 IMPLANT
CUFF TOURNIQUET SINGLE 44IN (TOURNIQUET CUFF) IMPLANT
DRAPE EXTREMITY T 121X128X90 (DRAPE) ×3 IMPLANT
DRAPE U-SHAPE 47X51 STRL (DRAPES) ×3 IMPLANT
DRSG AQUACEL AG ADV 3.5X10 (GAUZE/BANDAGES/DRESSINGS) ×3 IMPLANT
DURAPREP 26ML APPLICATOR (WOUND CARE) ×6 IMPLANT
ELECT REM PT RETURN 9FT ADLT (ELECTROSURGICAL) ×3
ELECTRODE REM PT RTRN 9FT ADLT (ELECTROSURGICAL) ×1 IMPLANT
EVACUATOR 1/8 PVC DRAIN (DRAIN) IMPLANT
GLOVE BIO SURGEON STRL SZ7.5 (GLOVE) ×9 IMPLANT
GLOVE BIO SURGEON STRL SZ8.5 (GLOVE) ×6 IMPLANT
GLOVE BIOGEL PI IND STRL 8 (GLOVE) ×1 IMPLANT
GLOVE BIOGEL PI IND STRL 9 (GLOVE) ×1 IMPLANT
GLOVE BIOGEL PI INDICATOR 8 (GLOVE) ×2
GLOVE BIOGEL PI INDICATOR 9 (GLOVE) ×2
GOWN STRL REUS W/ TWL LRG LVL3 (GOWN DISPOSABLE) ×1 IMPLANT
GOWN STRL REUS W/ TWL XL LVL3 (GOWN DISPOSABLE) ×2 IMPLANT
GOWN STRL REUS W/TWL LRG LVL3 (GOWN DISPOSABLE) ×3
GOWN STRL REUS W/TWL XL LVL3 (GOWN DISPOSABLE) ×4
HANDPIECE INTERPULSE COAX TIP (DISPOSABLE) ×3
HOOD PEEL AWAY FACE SHEILD DIS (HOOD) ×6 IMPLANT
KIT BASIN OR (CUSTOM PROCEDURE TRAY) ×3 IMPLANT
KIT ROOM TURNOVER OR (KITS) ×3 IMPLANT
MANIFOLD NEPTUNE II (INSTRUMENTS) ×3 IMPLANT
NEEDLE 22X1 1/2 (OR ONLY) (NEEDLE) ×6 IMPLANT
NEEDLE SPNL 18GX3.5 QUINCKE PK (NEEDLE) IMPLANT
NS IRRIG 1000ML POUR BTL (IV SOLUTION) ×3 IMPLANT
PACK TOTAL JOINT (CUSTOM PROCEDURE TRAY) ×3 IMPLANT
PAD ARMBOARD 7.5X6 YLW CONV (MISCELLANEOUS) ×6 IMPLANT
SET HNDPC FAN SPRY TIP SCT (DISPOSABLE) ×1 IMPLANT
SUT VIC AB 0 CT1 27 (SUTURE) ×3
SUT VIC AB 0 CT1 27XBRD ANBCTR (SUTURE) ×1 IMPLANT
SUT VIC AB 1 CTX 36 (SUTURE) ×3
SUT VIC AB 1 CTX36XBRD ANBCTR (SUTURE) ×1 IMPLANT
SUT VIC AB 2-0 CT1 27 (SUTURE)
SUT VIC AB 2-0 CT1 TAPERPNT 27 (SUTURE) IMPLANT
SUT VIC AB 3-0 CT1 27 (SUTURE) ×2
SUT VIC AB 3-0 CT1 TAPERPNT 27 (SUTURE) ×1 IMPLANT
SYR CONTROL 10ML LL (SYRINGE) ×6 IMPLANT
TOWEL OR 17X24 6PK STRL BLUE (TOWEL DISPOSABLE) ×3 IMPLANT
TOWEL OR 17X26 10 PK STRL BLUE (TOWEL DISPOSABLE) ×3 IMPLANT
TRAY CATH 16FR W/PLASTIC CATH (SET/KITS/TRAYS/PACK) IMPLANT
WATER STERILE IRR 1000ML POUR (IV SOLUTION) ×3 IMPLANT

## 2016-02-28 NOTE — Progress Notes (Signed)
Orthopedic Tech Progress Note Patient Details:  Brendan Joseph 1945/11/22 [id number]  CPM Right Knee CPM Right Knee: On Right Knee Flexion (Degrees): 40 Right Knee Extension (Degrees): 0 Additional Comments: trapeze bar patient helper Viewed order from doctor's order list  Hildred Priest 02/28/2016, 11:05 AM

## 2016-02-28 NOTE — Interval H&P Note (Signed)
History and Physical Interval Note:  02/28/2016 8:13 AM  Brendan Joseph  has presented today for surgery, with the diagnosis of RIGHT KNEE OSTEOARTHRITIS  The various methods of treatment have been discussed with the patient and family. After consideration of risks, benefits and other options for treatment, the patient has consented to  Procedure(s): RIGHT TOTAL KNEE ARTHROPLASTY (Right) as a surgical intervention .  The patient's history has been reviewed, patient examined, no change in status, stable for surgery.  I have reviewed the patient's chart and labs.  Questions were answered to the patient's satisfaction.     Kerin Salen

## 2016-02-28 NOTE — Transfer of Care (Signed)
Immediate Anesthesia Transfer of Care Note  Patient: Brendan Joseph  Procedure(s) Performed: Procedure(s): RIGHT TOTAL KNEE ARTHROPLASTY (Right)  Patient Location: PACU  Anesthesia Type:Spinal  Level of Consciousness: awake, alert  and oriented  Airway & Oxygen Therapy: Patient Spontanous Breathing and Patient connected to nasal cannula oxygen  Post-op Assessment: Report given to RN, Post -op Vital signs reviewed and stable and Patient moving all extremities X 4  Post vital signs: Reviewed  Last Vitals:  Filed Vitals:   02/28/16 0645  BP: 146/67  Pulse: 61  Temp: 37.2 C  Resp: 18    Last Pain: There were no vitals filed for this visit.    Patients Stated Pain Goal: 3 ([id number])  Complications: No apparent anesthesia complications

## 2016-02-28 NOTE — Anesthesia Preprocedure Evaluation (Addendum)
Anesthesia Evaluation  Patient identified by MRN, date of birth, ID band Patient awake    Reviewed: Allergy & Precautions, H&P , NPO status , Patient's Chart, lab work & pertinent test results  Airway Mallampati: I  TM Distance: >3 FB Neck ROM: full    Dental no notable dental hx. (+) Teeth Intact, Dental Advidsory Given, Poor Dentition   Pulmonary asthma , Current Smoker,    Pulmonary exam normal        Cardiovascular hypertension, Pt. on medications Normal cardiovascular exam     Neuro/Psych PSYCHIATRIC DISORDERS  Neuromuscular disease    GI/Hepatic GERD  Controlled and Medicated,  Endo/Other  diabetes  Renal/GU negative Renal ROS     Musculoskeletal   Abdominal Normal abdominal exam  (+)   Peds  Hematology negative hematology ROS (+)   Anesthesia Other Findings   Reproductive/Obstetrics negative OB ROS                           Anesthesia Physical Anesthesia Plan  ASA: II  Anesthesia Plan: Spinal   Post-op Pain Management:    Induction:   Airway Management Planned:   Additional Equipment:   Intra-op Plan:   Post-operative Plan:   Informed Consent: I have reviewed the patients History and Physical, chart, labs and discussed the procedure including the risks, benefits and alternatives for the proposed anesthesia with the patient or authorized representative who has indicated his/her understanding and acceptance.   History available from chart only and Dental Advisory Given  Plan Discussed with: CRNA and Surgeon  Anesthesia Plan Comments:        Anesthesia Quick Evaluation

## 2016-02-28 NOTE — Evaluation (Signed)
Physical Therapy Evaluation Patient Details Name: Brendan Joseph MRN: [id number] DOB: 1945-10-31 Today's Date: 02/28/2016   History of Present Illness  Pt is a 70 y/o male s/p R TKR. PMH including but not limited to HTN, DM, L humerus fx 2009, chronic Hep C, bilateral carpal tunnel.   Clinical Impression  Pt presented supine in bed with HOB elevated, and CPM on. Pt reported no pain at beginning of session, only a "tightness" at R knee and calf. Pt tolerated all therapeutic interventions very well. Pt would continue to benefit from skilled physical therapy services at this time while inpatient and after d/c.      Follow Up Recommendations SNF    Equipment Recommendations  Rolling walker with 5" wheels    Recommendations for Other Services       Precautions / Restrictions Precautions Precautions: Fall Restrictions Weight Bearing Restrictions: Yes RLE Weight Bearing: Weight bearing as tolerated      Mobility  Bed Mobility Overal bed mobility: Needs Assistance Bed Mobility: Supine to Sit     Supine to sit: Min guard;HOB elevated     General bed mobility comments: Pt required increased time  Transfers Overall transfer level: Needs assistance Equipment used: Rolling walker (2 wheeled) Transfers: Sit to/from Stand Sit to Stand: Min guard         General transfer comment: Pt required increased time and verbal cueing for bilateral hand positioning to assist with transfer  Ambulation/Gait Ambulation/Gait assistance: Min guard Ambulation Distance (Feet): 30 Feet Assistive device: Rolling walker (2 wheeled) Gait Pattern/deviations: Step-to pattern;Decreased step length - left;Decreased stance time - right Gait velocity: decreased Gait velocity interpretation: Below normal speed for age/gender    Stairs            Wheelchair Mobility    Modified Rankin (Stroke Patients Only)       Balance Overall balance assessment: Needs assistance Sitting-balance  support: No upper extremity supported Sitting balance-Leahy Scale: Good     Standing balance support: Bilateral upper extremity supported Standing balance-Leahy Scale: Poor                               Pertinent Vitals/Pain Pain Assessment: No/denies pain (pt reported "tightness" in R knee and calf)    Home Living Family/patient expects to be discharged to:: Skilled nursing facility                      Prior Function Level of Independence: Independent         Comments: Pt lives alone and does everything independently. He is retired and enjoys Marketing executive.     Hand Dominance        Extremity/Trunk Assessment   Upper Extremity Assessment: Overall WFL for tasks assessed           Lower Extremity Assessment: RLE deficits/detail RLE Deficits / Details: Pt with decreased strength and ROM limitations secondary to post-op. Sensation grossly intact.       Communication   Communication: No difficulties  Cognition Arousal/Alertness: Awake/alert Behavior During Therapy: WFL for tasks assessed/performed Overall Cognitive Status: Within Functional Limits for tasks assessed                      General Comments      Exercises Total Joint Exercises Ankle Circles/Pumps: AROM;Strengthening;Right;10 reps;Seated Straight Leg Raises: AROM;Strengthening;Right;10 reps;Supine Long Arc Quad: AROM;Strengthening;Right;10 reps;Seated Knee Flexion: AROM;Strengthening;Right;10 reps;Seated Marching in Standing:  AROM;Strengthening;Both;10 reps;Seated      Assessment/Plan    PT Assessment Patient needs continued PT services  PT Diagnosis Difficulty walking   PT Problem List Decreased strength;Decreased range of motion;Decreased activity tolerance;Decreased balance;Decreased mobility;Decreased coordination;Decreased knowledge of use of DME;Decreased safety awareness  PT Treatment Interventions DME instruction;Gait training;Functional mobility  training;Therapeutic activities;Therapeutic exercise;Balance training;Patient/family education   PT Goals (Current goals can be found in the Care Plan section) Acute Rehab PT Goals Patient Stated Goal: pt would like to return home and return to PLOF PT Goal Formulation: With patient Time For Goal Achievement: 03/06/16 Potential to Achieve Goals: Good    Frequency 7X/week   Barriers to discharge        Co-evaluation               End of Session Equipment Utilized During Treatment: Gait belt Activity Tolerance: Patient tolerated treatment well Patient left: in chair;with call bell/phone within reach Nurse Communication: Mobility status         Time: [id number] PT Time Calculation (min) (ACUTE ONLY): 21 min   Charges:   PT Evaluation $PT Eval Moderate Complexity: 1 Procedure     PT G CodesClearnce Sorrel Haidyn Kilburg 02/28/2016, 4:35 PM Sherie Don, [place], DPT [phone]

## 2016-02-28 NOTE — Anesthesia Procedure Notes (Addendum)
Spinal Patient location during procedure: OR Start time: 02/28/2016 8:40 AM End time: 02/28/2016 8:43 AM Staffing Anesthesiologist: Lyn Hollingshead Performed by: anesthesiologist  Preanesthetic Checklist Completed: patient identified, surgical consent, pre-op evaluation, timeout performed, IV checked, risks and benefits discussed and monitors and equipment checked Spinal Block Patient position: sitting Prep: site prepped and draped and DuraPrep Patient monitoring: heart rate, cardiac monitor, continuous pulse ox and blood pressure Approach: midline Location: L3-4 Injection technique: single-shot Needle Needle type: Pencan  Needle gauge: 24 G Needle length: 9 cm Needle insertion depth: 7 cm Assessment Sensory level: T10  Procedure Name: MAC Date/Time: 02/28/2016 8:30 AM Performed by: Neldon Newport Pre-anesthesia Checklist: Timeout performed, Patient being monitored, Suction available, Emergency Drugs available and Patient identified Patient Re-evaluated:Patient Re-evaluated prior to inductionOxygen Delivery Method: Simple face mask Placement Confirmation: positive ETCO2

## 2016-02-28 NOTE — Anesthesia Postprocedure Evaluation (Signed)
Anesthesia Post Note  Patient: Brendan Joseph  Procedure(s) Performed: Procedure(s) (LRB): RIGHT TOTAL KNEE ARTHROPLASTY (Right)  Patient location during evaluation: PACU Anesthesia Type: Spinal Level of consciousness: awake Pain management: pain level controlled Vital Signs Assessment: post-procedure vital signs reviewed and stable Respiratory status: spontaneous breathing Cardiovascular status: stable Postop Assessment: no headache, no backache, spinal receding, patient able to bend at knees and no signs of nausea or vomiting Anesthetic complications: no     Last Vitals:  Filed Vitals:   02/28/16 1100 02/28/16 1104  BP:  148/55  Pulse: 44 41  Temp:    Resp: 13 12    Last Pain:  Filed Vitals:   02/28/16 1106  PainSc: 0-No pain   Pain Goal: Patients Stated Pain Goal: 3 (02/28/16 0645)               [name]

## 2016-02-28 NOTE — Op Note (Signed)
PATIENT ID:      Brendan Joseph  MRN:     [id number] DOB/AGE:    [birth year] / 70 y.o.       OPERATIVE REPORT    DATE OF PROCEDURE:  02/28/2016       PREOPERATIVE DIAGNOSIS:   RIGHT KNEE OSTEOARTHRITIS      Estimated body mass index is 28.90 kg/(m^2) as calculated from the following:   Height as of 02/15/16: 5\' 8"  (1.727 m).   Weight as of 12/26/14: 86.183 kg (190 lb).                                                        POSTOPERATIVE DIAGNOSIS:   RIGHT KNEE OSTEOARTHRITIS                                                                      PROCEDURE:  Procedure(s): RIGHT TOTAL KNEE ARTHROPLASTY Using DepuyAttune RP implants #5R Femur, #6Tibia, 5 mm Attune RP bearing, 41 Patella     SURGEON: Neilson Oehlert J    ASSISTANT:   [employer]   (Present and scrubbed throughout the case, critical for assistance with exposure, retraction, instrumentation, and closure.)         ANESTHESIA: Spinal, 20cc Exparel, 20cc 0.5% Marcaine  EBL: 300  FLUID REPLACEMENT: 1500 crystalloid  TOURNIQUET TIME: 30min  Drains: None  Tranexamic Acid: 1gm iv, 2gm topical   COMPLICATIONS:  None         INDICATIONS FOR PROCEDURE: The patient has  RIGHT KNEE OSTEOARTHRITIS, Var deformities, XR shows bone on bone arthritis, lateral subluxation of tibia. Patient has failed all conservative measures including anti-inflammatory medicines, narcotics, attempts at  exercise and weight loss, cortisone injections and viscosupplementation.  Risks and benefits of surgery have been discussed, questions answered.   DESCRIPTION OF PROCEDURE: The patient identified by armband, received  IV antibiotics, in the holding area at [facility]. Patient taken to the operating room, appropriate anesthetic  monitors were attached, and Spinal anesthesia was  induced. Tourniquet  applied high to the operative thigh. Lateral post and foot positioner  applied to the table, the lower extremity was then prepped and draped  in usual  sterile fashion from the toes to the tourniquet. Time-out procedure was performed. We began the operation, with the knee flexed 120 degrees, by making the anterior midline incision starting at handbreadth above the patella going over the patella 1 cm medial to and 4 cm distal to the tibial tubercle. Small bleeders in the skin and the  subcutaneous tissue identified and cauterized. Transverse retinaculum was incised and reflected medially and a medial parapatellar arthrotomy was accomplished. the patella was everted and theprepatellar fat pad resected. The superficial medial collateral  ligament was then elevated from anterior to posterior along the proximal  flare of the tibia and anterior half of the menisci resected. The knee was hyperflexed exposing bone on bone arthritis. Peripheral and notch osteophytes as well as the cruciate ligaments were then resected. We continued to  work our way around posteriorly along the proximal tibia,  and externally  rotated the tibia subluxing it out from underneath the femur. A McHale  retractor was placed through the notch and a lateral Hohmann retractor  placed, and we then drilled through the proximal tibia in line with the  axis of the tibia followed by an intramedullary guide rod and 2-degree  posterior slope cutting guide. The tibial cutting guide, 3 degree posterior sloped, was pinned into place allowing resection of 3 mm of bone medially and 11 mm of bone laterally. Satisfied with the tibial resection, we then  entered the distal femur 2 mm anterior to the PCL origin with the  intramedullary guide rod and applied the distal femoral cutting guide  set at 9 mm, with 5 degrees of valgus. This was pinned along the  epicondylar axis. At this point, the distal femoral cut was accomplished without difficulty. We then sized for a #5L femoral component and pinned the guide in 3 degrees of external rotation. The chamfer cutting guide was pinned into place. The anterior,  posterior, and chamfer cuts were accomplished without difficulty followed by  the Attune RP box cutting guide and the box cut. We also removed posterior osteophytes from the posterior femoral condyles. At this  time, the knee was brought into full extension. We checked our  extension and flexion gaps and found them symmetric for a 5 mm bearing. Distracting in extension with a lamina spreader, the posterior horns of the menisci were removed, and Exparel, diluted to 60 cc, with 20cc NS, and 20cc 0.5% Marcaine,was injected into the capsule and synovium of the knee. The posterior patella cut was accomplished with the 9.5 mm Attune cutting guide, sized for a 13mm dome, and the fixation pegs drilled.The knee  was then once again hyperflexed exposing the proximal tibia. We sized for a # 6 tibial base plate, applied the smokestack and the conical reamer followed by the the Delta fin keel punch. We then hammered into place the Attune RP trial femoral component, drilled the lugs, inserted a  5Blood and plasma separated in drain indicating minimal recent drainage, drain pulled without difficulty. mm trial bearing, trial patellar button, and took the knee through range of motion from 0-130 degrees. No thumb pressure was required for patellar Tracking. At this point, the limb was wrapped with an Esmarch bandage and the tourniquet inflated to 350 mmHg. All trial components were removed, mating surfaces irrigated with pulse lavage, and dried with suction and sponges. A double batch of DePuy HV cement with 1500 mg of Zinacef was mixed and applied to all bony metallic mating surfaces except for the posterior condyles of the femur itself. In order, we  hammered into place the tibial tray and removed excess cement, the femoral component and removed excess cement. The final Attune RP bearing  was inserted, and the knee brought to full extension with compression.  The patellar button was clamped into place, and excess cement   removed. While the cement cured the wound was irrigated out with normal saline solution pulse lavage. Ligament stability and patellar tracking were checked and found to be excellent. The parapatellar arthrotomy was closed with  running #1 Vicryl suture. The subcutaneous tissue with 0 and 2-0 undyed  Vicryl suture, and the skin with running 3-0 SQ vicryl. A dressing of Xeroform,  4 x 4, dressing sponges, Webril, and Ace wrap applied. The patient  awakened, and taken to recovery room without difficulty.   [place] Francisco J 02/28/2016, 9:58 AM

## 2016-02-29 ENCOUNTER — Encounter (HOSPITAL_COMMUNITY): Payer: Self-pay | Admitting: Orthopedic Surgery

## 2016-02-29 LAB — BASIC METABOLIC PANEL
Anion gap: 6 (ref 5–15)
BUN: 8 mg/dL (ref 6–20)
CHLORIDE: 105 mmol/L (ref 101–111)
CO2: 24 mmol/L (ref 22–32)
CREATININE: 0.79 mg/dL (ref 0.61–1.24)
Calcium: 8.1 mg/dL — ABNORMAL LOW (ref 8.9–10.3)
GFR calc Af Amer: >60 mL/min (ref >60)
GLUCOSE: 143 mg/dL — AB (ref 65–99)
POTASSIUM: 3.9 mmol/L (ref 3.5–5.1)
SODIUM: 135 mmol/L (ref 135–145)

## 2016-02-29 LAB — CBC
HCT: 39.6 % (ref 39.0–52.0)
Hemoglobin: 12.6 g/dL — ABNORMAL LOW (ref 13.0–17.0)
MCH: 29.3 pg (ref 26.0–34.0)
MCHC: 31.8 g/dL (ref 30.0–36.0)
MCV: 92.1 fL (ref 78.0–100.0)
PLATELETS: 241 10*3/uL (ref 150–400)
RBC: 4.3 MIL/uL (ref 4.22–5.81)
RDW: 15.1 % (ref 11.5–15.5)
WBC: 10.3 10*3/uL (ref 4.0–10.5)

## 2016-02-29 NOTE — Progress Notes (Signed)
Orthopedic Tech Progress Note Patient Details:  Brendan Joseph 10-17-1945 [id number]  Patient ID: [name], male   DOB: 1946-01-21, 70 y.o.   MRN: [id number]  applied cpm 10-40  Karolee Stamps 02/29/2016, 6:28 AM

## 2016-02-29 NOTE — Clinical Social Work Placement (Signed)
   CLINICAL SOCIAL WORK PLACEMENT  NOTE  Date:  02/29/2016  Patient Details  Name: Brendan Joseph MRN: [id number] Date of Birth: 02-03-1946  Clinical Social Work is seeking post-discharge placement for this patient at the [place] level of care (*CSW will initial, date and re-position this form in  chart as items are completed):  Yes   Patient/family provided with [place] Work Department's list of facilities offering this level of care within the geographic area requested by the patient (or if unable, by the patient's family).  Yes   Patient/family informed of their freedom to choose among providers that offer the needed level of care, that participate in Medicare, Medicaid or managed care program needed by the patient, have an available bed and are willing to accept the patient.  Yes   Patient/family informed of [facility]'s ownership interest in [facility] and [facility], as well as of the fact that they are under no obligation to receive care at these facilities.  PASRR submitted to EDS on 02/29/16     PASRR number received on 02/29/16     Existing PASRR number confirmed on       FL2 transmitted to all facilities in geographic area requested by pt/family on 02/29/16     FL2 transmitted to all facilities within larger geographic area on       Patient informed that his/her managed care company has contracts with or will negotiate with certain facilities, including the following:        Yes   Patient/family informed of bed offers received.  Patient chooses bed at [facility]     Physician recommends and patient chooses bed at [facility]    Patient to be transferred to [facility] on  .  Patient to be transferred to facility by [facility]     Patient family notified on   of transfer.  Name of family member notified:        PHYSICIAN Please sign FL2     Additional Comment:     _______________________________________________ Caroline Sauger, LCSW 02/29/2016, 3:06 PM

## 2016-02-29 NOTE — Progress Notes (Signed)
OT Cancellation Note  Patient Details Name: Brendan Joseph MRN: [id number] DOB: 02/20/1946   Cancelled Treatment:    Reason Eval/Treat Not Completed: Other (comment) Pt is Medicare and current D/C plan is SNF. No apparent immediate acute care OT needs, therefore will defer OT to SNF. If OT eval is needed please call Acute Rehab Dept. at [phone] or text page OT at [phone].    Almon Register [id number] 02/29/2016, 9:19 AM

## 2016-02-29 NOTE — Clinical Social Work Note (Signed)
Clinical Social Work Assessment  Patient Details  Name: Brendan Joseph MRN: [id number] Date of Birth: [birth year]  Date of referral:  02/29/16               Reason for consult:  Facility Placement                Permission sought to share information with:  Chartered certified accountant granted to share information::  Yes, Verbal Permission Granted  Name::        Agency::  [facility]  Relationship::     Contact Information:     Housing/Transportation Living arrangements for the past 2 months:  Single Family Home Source of Information:  Patient Patient Interpreter Needed:  None Criminal Activity/Legal Involvement Pertinent to Current Situation/Hospitalization:  No - Comment as needed Significant Relationships:  Spouse Lives with:  Spouse Do you feel safe going back to the place where you live?  No (Fall risk) Need for family participation in patient care:  No (Coment) (Patient able to make own decisions.)  Care giving concerns:  Patient expressed no concerns at this time.   Social Worker assessment / plan:  BUNDLE PATIENT. LCSW received referral for possible SNF placement. Per RN liaison, patient will be discharged to [facility] once medically stable. LCSW to continue to follow and assist with discharge planning needs.  Employment status:  Retired Forensic scientist:  Medicare PT Recommendations:  [place] / Referral to community resources:  [place]  Patient/Family's Response to care:  Patient understanding and agreeable to [employer] of care.  Patient/Family's Understanding of and Emotional Response to Diagnosis, Current Treatment, and Prognosis:  Patient understanding and agreeable to LCSW plan of care.  Emotional Assessment Appearance:  Appears stated age Attitude/Demeanor/Rapport:  Other (Appropriate) Affect (typically observed):  Accepting, Appropriate, Pleasant Orientation:   Oriented to Self, Oriented to Place, Oriented to  Time, Oriented to Situation Alcohol / Substance use:  Not Applicable Psych involvement (Current and /or in the community):  No (Comment) (Not appropriate on this admission.)  Discharge Needs  Concerns to be addressed:  No discharge needs identified Readmission within the last 30 days:  No Current discharge risk:  None Barriers to Discharge:  No Barriers Identified   Caroline Sauger, LCSW 02/29/2016, 3:11 PM [phone]

## 2016-02-29 NOTE — NC FL2 (Signed)
[place] LEVEL OF CARE SCREENING TOOL     IDENTIFICATION  Patient Name: Brendan Joseph Birthdate: [birth year] Sex: male Admission Date (Current Location): 02/28/2016  [facility] and [place] Number:  Herbalist and Address:  [facility] [place], [place] [address], [place], [place] [address]      Provider Number: [id number]  Attending Physician Name and Address:  Frederik Pear, MD  Relative Name and Phone Number:       Current Level of Care: Hospital Recommended Level of Care: [place] Prior Approval Number:    Date Approved/Denied:   PASRR Number: [id number]  Discharge Plan: SNF    Current Diagnoses: Patient Active Problem List   Diagnosis Date Noted  . Primary osteoarthritis of right knee 02/27/2016    Orientation RESPIRATION BLADDER Height & Weight     Self, Time, Situation, Place  Normal Continent Weight:   Height:     BEHAVIORAL SYMPTOMS/MOOD NEUROLOGICAL BOWEL NUTRITION STATUS      Continent Diet (Please see discharge summary.)  AMBULATORY STATUS COMMUNICATION OF NEEDS Skin   Limited Assist Verbally Surgical wounds                       Personal Care Assistance Level of Assistance  Bathing, Feeding, Dressing Bathing Assistance: Maximum assistance Feeding assistance: Independent Dressing Assistance: Maximum assistance     Functional Limitations Info             SPECIAL CARE FACTORS FREQUENCY  PT (By licensed PT), OT (By licensed OT)     PT Frequency: 5 OT Frequency: 5            Contractures      Additional Factors Info  Code Status, Allergies Code Status Info: Statins Allergies Info: FULL           Current Medications (02/29/2016):  This is the current hospital active medication list Current Facility-Administered Medications  Medication Dose Route Frequency Provider Last Rate Last Dose  . acetaminophen (TYLENOL) tablet 650 mg  650 mg Oral Q6H PRN Frederik Pear, MD   650 mg at  02/29/16 1227   Or  . acetaminophen (TYLENOL) suppository 650 mg  650 mg Rectal Q6H PRN Frederik Pear, MD      . albuterol (PROVENTIL) (2.5 MG/3ML) 0.083% nebulizer solution 2.5 mg  2.5 mg Inhalation Q6H PRN Frederik Pear, MD      . alum & mag hydroxide-simeth (MAALOX/MYLANTA) 200-200-20 MG/5ML suspension 30 mL  30 mL Oral Q4H PRN Frederik Pear, MD      . amLODipine (NORVASC) tablet 5 mg  5 mg Oral q morning - 10a Frederik Pear, MD   5 mg at 02/29/16 [id number]  . aspirin EC tablet 325 mg  325 mg Oral Q breakfast Frederik Pear, MD   325 mg at 02/29/16 0853  . bisacodyl (DULCOLAX) EC tablet 5 mg  5 mg Oral Daily PRN Frederik Pear, MD      . dextrose 5 % and 0.45 % NaCl with KCl 20 mEq/L infusion   Intravenous Continuous Frederik Pear, MD 125 mL/hr at 02/29/16 0125    . diphenhydrAMINE (BENADRYL) 12.5 MG/5ML elixir 12.5-25 mg  12.5-25 mg Oral Q4H PRN Frederik Pear, MD      . docusate sodium (COLACE) capsule 100 mg  100 mg Oral BID Frederik Pear, MD   100 mg at 02/29/16 0953  . HYDROmorphone (DILAUDID) injection 0.5-1 mg  0.5-1 mg Intravenous Q2H PRN Frederik Pear, MD  1 mg at 02/29/16 0910  . irbesartan (AVAPRO) tablet 300 mg  300 mg Oral Daily Frederik Pear, MD   300 mg at 02/29/16 0953  . menthol-cetylpyridinium (CEPACOL) lozenge 3 mg  1 lozenge Oral PRN Frederik Pear, MD       Or  . phenol (CHLORASEPTIC) mouth spray 1 spray  1 spray Mouth/Throat PRN Frederik Pear, MD      . methocarbamol (ROBAXIN) tablet 500 mg  500 mg Oral Q6H PRN Frederik Pear, MD   500 mg at 02/29/16 1227   Or  . methocarbamol (ROBAXIN) 500 mg in dextrose 5 % 50 mL IVPB  500 mg Intravenous Q6H PRN Frederik Pear, MD      . metoCLOPramide (REGLAN) tablet 5-10 mg  5-10 mg Oral Q8H PRN Frederik Pear, MD       Or  . metoCLOPramide (REGLAN) injection 5-10 mg  5-10 mg Intravenous Q8H PRN Frederik Pear, MD      . mometasone-formoterol [facility]) 200-5 MCG/ACT inhaler 2 puff  2 puff Inhalation BID Frederik Pear, MD   2 puff at 02/29/16 0816  . ondansetron (ZOFRAN) tablet 4  mg  4 mg Oral Q6H PRN Frederik Pear, MD       Or  . ondansetron [facility]) injection 4 mg  4 mg Intravenous Q6H PRN Frederik Pear, MD      . oxyCODONE (Oxy IR/ROXICODONE) immediate release tablet 5-10 mg  5-10 mg Oral Q3H PRN Frederik Pear, MD   10 mg at 02/29/16 1227  . senna-docusate (Senokot-S) tablet 1 tablet  1 tablet Oral QHS PRN Frederik Pear, MD      . sodium phosphate (FLEET) 7-19 GM/118ML enema 1 enema  1 enema Rectal Once PRN Frederik Pear, MD         Discharge Medications: Please see discharge summary for a list of discharge medications.  Relevant Imaging Results:  Relevant Lab Results:   Additional Information SSN: [id number]  Caroline Sauger, LCSW

## 2016-02-29 NOTE — Progress Notes (Signed)
Orthopedic Tech Progress Note Patient Details:  Brendan Joseph 02/26/1946 [id number]  CPM Right Knee CPM Right Knee: On Right Knee Flexion (Degrees): 40 Right Knee Extension (Degrees): 10 Additional Comments: trapeze bar patient helper   [name] 02/29/2016, 4:45 PM

## 2016-02-29 NOTE — Progress Notes (Signed)
Patient ID: Brendan Joseph, male   DOB: 1945-09-01, 70 y.o.   MRN: [id number] PATIENT ID: Brendan Joseph  MRN: [id number]  DOB/AGE:  04/06/1946 / 70 y.o.  1 Day Post-Op Procedure(s) (LRB): RIGHT TOTAL KNEE ARTHROPLASTY (Right)    PROGRESS NOTE Subjective: Patient is alert, oriented, no Nausea, no Vomiting, yes passing gas. Taking PO well. Denies SOB, Chest or Calf Pain. Using Incentive Spirometer, PAS in place. Ambulate WBAT, CPM 0-40 Patient reports pain as 3/10 .    Objective: Vital signs in last 24 hours: Filed Vitals:   02/28/16 2011 02/28/16 2034 02/29/16 0019 02/29/16 0426  BP:  145/60 130/58 136/60  Pulse:  63 66 72  Temp:  98.1 F (36.7 C) 99 F (37.2 C) 99.3 F (37.4 C)  Resp:  15 16 17   SpO2: 96% 96% 93% 95%      Intake/Output from previous day: I/O last 3 completed shifts: In: 3043.3 [P.O.:240; I.V.:2660; IV Piggyback:143.3] Out: 1600 [Urine:1450; Blood:150]   Intake/Output this shift:     LABORATORY DATA:  Recent Labs  02/28/16 0646 02/28/16 1037  GLUCAP 118* 98    Examination: Neurologically intact ABD soft Neurovascular intact Sensation intact distally Intact pulses distally Dorsiflexion/Plantar flexion intact Incision: dressing C/D/I No cellulitis present Compartment soft}  Assessment:   1 Day Post-Op Procedure(s) (LRB): RIGHT TOTAL KNEE ARTHROPLASTY (Right) ADDITIONAL DIAGNOSIS: Expected Acute Blood Loss Anemia, Hypertension  Plan: PT/OT WBAT, CPM 5/hrs day until ROM 0-90 degrees, then D/C CPM DVT Prophylaxis:  SCDx72hrs, ASA 325 mg BID x 2 weeks DISCHARGE PLAN: Skilled Nursing Facility/Rehab, [name] DISCHARGE NEEDS: HHPT, CPM, Walker and 3-in-1 comode seat     Brendan Joseph J 02/29/2016, 7:31 AM

## 2016-02-29 NOTE — Care Management Important Message (Signed)
Important Message  Patient Details  Name: Brendan Joseph MRN: [id number] Date of Birth: 08/26/1945   Medicare Important Message Given:  Yes    Rjay Revolorio, [place]oy [place] 02/29/2016, 8:22 AM

## 2016-02-29 NOTE — Progress Notes (Signed)
Physical Therapy Treatment Patient Details Name: Brendan Joseph MRN: [id number] DOB: 04/24/1946 Today's Date: 02/29/2016    History of Present Illness Pt is a 70 y/o male s/p R TKR. PMH including but not limited to HTN, DM, L humerus fx 2009, chronic Hep C, bilateral carpal tunnel.     PT Comments    Pt with increased pain to 10/10 today in R knee which greatly limited his treatment session. He continues to demonstrate decreased strength and ROM limitations in his R LE. PT measured pt's knee flexion and extension ROM in sitting (Ext. = lacking 10 degrees to neutral; Flex = 42 degrees). Pt would continue to benefit from skilled physical therapy services at this time while inpatient and after d/c.    Follow Up Recommendations  SNF     Equipment Recommendations  Rolling walker with 5" wheels    Recommendations for Other Services       Precautions / Restrictions Precautions Precautions: Fall Restrictions Weight Bearing Restrictions: Yes RLE Weight Bearing: Weight bearing as tolerated    Mobility  Bed Mobility Overal bed mobility: Needs Assistance Bed Mobility: Supine to Sit     Supine to sit: HOB elevated;Min assist (with R LE)     General bed mobility comments: Pt required increased time and min A for R LE movement  Transfers Overall transfer level: Needs assistance Equipment used: Rolling walker (2 wheeled) Transfers: Sit to/from Stand Sit to Stand: Min guard         General transfer comment: Pt required increased time and verbal cueing for bilateral hand positioning to assist with transfer  Ambulation/Gait Ambulation/Gait assistance: Min guard Ambulation Distance (Feet): 20 Feet Assistive device: Rolling walker (2 wheeled) Gait Pattern/deviations: Step-to pattern;Antalgic;Decreased step length - left;Decreased stance time - right;Decreased weight shift to right Gait velocity: decreased Gait velocity interpretation: Below normal speed for age/gender General  Gait Details: Pt limited more by pain today as compared to previous session   Stairs            Wheelchair Mobility    Modified Rankin (Stroke Patients Only)       Balance Overall balance assessment: Needs assistance Sitting-balance support: Bilateral upper extremity supported Sitting balance-Leahy Scale: Poor     Standing balance support: No upper extremity supported Standing balance-Leahy Scale: Fair                      Cognition Arousal/Alertness: Awake/alert Behavior During Therapy: WFL for tasks assessed/performed Overall Cognitive Status: Within Functional Limits for tasks assessed                      Exercises Total Joint Exercises Ankle Circles/Pumps: AROM;Strengthening;Right;10 reps;Seated Quad Sets: AROM;Strengthening;Right;5 reps Long Arc Quad: AROM;Strengthening;Right;5 reps;Other (comment) (limited by pain) Knee Flexion: AROM;Strengthening;Right;5 reps;Other (comment) (limited by pain') Goniometric ROM: Extension = lacking 10 degrees to neutral; Flexion = 42 degrees    General Comments        Pertinent Vitals/Pain Pain Assessment: 0-10 Pain Score: 10-Worst pain ever Pain Location: R knee Pain Descriptors / Indicators: Sharp;Sore;Discomfort;Guarding Pain Intervention(s): Limited activity within patient's tolerance;Monitored during session;Repositioned;Patient requesting pain meds-RN notified    Home Living                      Prior Function            PT Goals (current goals can now be found in the care plan section) Acute Rehab PT Goals Patient Stated  Goal: pt would like to return home and return to PLOF PT Goal Formulation: With patient Time For Goal Achievement: 03/06/16 Potential to Achieve Goals: Good Progress towards PT goals: Progressing toward goals    Frequency  7X/week    PT Plan Current plan remains appropriate    Co-evaluation             End of Session Equipment Utilized During  Treatment: Gait belt Activity Tolerance: Patient limited by pain Patient left: in chair;with call bell/phone within reach     Time: 1145-1206 PT Time Calculation (min) (ACUTE ONLY): 21 min  Charges:  $Gait Training: 8-22 mins                    G CodesClearnce Sorrel Tawni Melkonian 03/23/16, 12:42 PM  Sherie Don, [place], DPT [phone]

## 2016-02-29 NOTE — Discharge Planning (Signed)
Patient seen on rounds. He is doing well. Complaining of pain that started after therapy. Wanting pain meds every 2 hours.  Patient will go to [facility] when medically ready. He will need ambulance transport. CSW aware.  Will follow patient at facility.   Ladell Heads, [place]  [facility]  [phone]

## 2016-03-01 DIAGNOSIS — G5601 Carpal tunnel syndrome, right upper limb: Secondary | ICD-10-CM | POA: Diagnosis not present

## 2016-03-01 DIAGNOSIS — Z96659 Presence of unspecified artificial knee joint: Secondary | ICD-10-CM | POA: Diagnosis not present

## 2016-03-01 DIAGNOSIS — K210 Gastro-esophageal reflux disease with esophagitis: Secondary | ICD-10-CM | POA: Diagnosis not present

## 2016-03-01 DIAGNOSIS — I10 Essential (primary) hypertension: Secondary | ICD-10-CM | POA: Diagnosis not present

## 2016-03-01 DIAGNOSIS — M1990 Unspecified osteoarthritis, unspecified site: Secondary | ICD-10-CM | POA: Diagnosis not present

## 2016-03-01 DIAGNOSIS — J45909 Unspecified asthma, uncomplicated: Secondary | ICD-10-CM | POA: Diagnosis not present

## 2016-03-01 DIAGNOSIS — B182 Chronic viral hepatitis C: Secondary | ICD-10-CM | POA: Diagnosis not present

## 2016-03-01 DIAGNOSIS — R278 Other lack of coordination: Secondary | ICD-10-CM | POA: Diagnosis not present

## 2016-03-01 DIAGNOSIS — M6281 Muscle weakness (generalized): Secondary | ICD-10-CM | POA: Diagnosis not present

## 2016-03-01 DIAGNOSIS — G5602 Carpal tunnel syndrome, left upper limb: Secondary | ICD-10-CM | POA: Diagnosis not present

## 2016-03-01 DIAGNOSIS — Z471 Aftercare following joint replacement surgery: Secondary | ICD-10-CM | POA: Diagnosis not present

## 2016-03-01 DIAGNOSIS — M25569 Pain in unspecified knee: Secondary | ICD-10-CM | POA: Diagnosis not present

## 2016-03-01 DIAGNOSIS — F330 Major depressive disorder, recurrent, mild: Secondary | ICD-10-CM | POA: Diagnosis not present

## 2016-03-01 DIAGNOSIS — E785 Hyperlipidemia, unspecified: Secondary | ICD-10-CM | POA: Diagnosis not present

## 2016-03-01 DIAGNOSIS — R2689 Other abnormalities of gait and mobility: Secondary | ICD-10-CM | POA: Diagnosis not present

## 2016-03-01 DIAGNOSIS — E119 Type 2 diabetes mellitus without complications: Secondary | ICD-10-CM | POA: Diagnosis not present

## 2016-03-01 DIAGNOSIS — F322 Major depressive disorder, single episode, severe without psychotic features: Secondary | ICD-10-CM | POA: Diagnosis not present

## 2016-03-01 LAB — CBC
HEMATOCRIT: 39.1 % (ref 39.0–52.0)
Hemoglobin: 12.3 g/dL — ABNORMAL LOW (ref 13.0–17.0)
MCH: 29.2 pg (ref 26.0–34.0)
MCHC: 31.5 g/dL (ref 30.0–36.0)
MCV: 92.9 fL (ref 78.0–100.0)
PLATELETS: 251 10*3/uL (ref 150–400)
RBC: 4.21 MIL/uL — AB (ref 4.22–5.81)
RDW: 15.1 % (ref 11.5–15.5)
WBC: 13 10*3/uL — AB (ref 4.0–10.5)

## 2016-03-01 MED ORDER — OXYCODONE-ACETAMINOPHEN 5-325 MG PO TABS: 1.0000 | ORAL_TABLET | ORAL | Status: DC | PRN

## 2016-03-01 MED ORDER — ASPIRIN EC 325 MG PO TBEC: 325.0000 mg | DELAYED_RELEASE_TABLET | Freq: Two times a day (BID) | ORAL | Status: AC

## 2016-03-01 MED ORDER — TIZANIDINE HCL 2 MG PO TABS: 2.0000 mg | ORAL_TABLET | ORAL | Status: DC | PRN

## 2016-03-01 NOTE — Discharge Summary (Signed)
Patient ID: Brendan Joseph MRN: [id number] DOB/AGE: 03-14-1946 70 y.o.  Admit date: 02/28/2016 Discharge date: 03/01/2016  Admission Diagnoses:  Principal Problem:   Primary osteoarthritis of right knee   Discharge Diagnoses:  Same  Past Medical History  Diagnosis Date  . Depression   . Hyperlipidemia     takes Crestor   . Bilateral carpal tunnel syndrome   . Right knee DJD     DJD- right knee cortisone shot 1 month ago  . Hypertension     takes Micardis and Amlodipine daily  . Asthma, persistent     Albuterol as needed.Symbicort daily  . History of colon polyps     benign  . Family hx of colon cancer   . Joint pain   . Joint swelling   . GERD (gastroesophageal reflux disease)     occasionally and will take OTC med if needs  . Diabetes mellitus, type II ([place])     controlled by diet and weight loss  . Chronic hepatitis C ([place])     tx. with meds-now showing no enzyme elevation    Surgeries: Procedure(s): RIGHT TOTAL KNEE ARTHROPLASTY on 02/28/2016   Consultants:    Discharged Condition: Improved  Hospital Course: ZALEN KRALY is an 70 y.o. male who was admitted 02/28/2016 for operative treatment ofPrimary osteoarthritis of right knee. Patient has severe unremitting pain that affects sleep, daily activities, and work/hobbies. After pre-op clearance the patient was taken to the operating room on 02/28/2016 and underwent  Procedure(s): RIGHT TOTAL KNEE ARTHROPLASTY.    Patient was given perioperative antibiotics: Anti-infectives    Start     Dose/Rate Route Frequency Ordered Stop   02/28/16 0911  cefUROXime (ZINACEF) injection  Status:  Discontinued       As needed 02/28/16 0912 02/28/16 1030   02/28/16 0800  ceFAZolin (ANCEF) 3 g in dextrose 5 % 50 mL IVPB     3 g 130 mL/hr over 30 Minutes Intravenous To ShortStay Surgical 02/27/16 1030 02/28/16 0838       Patient was given sequential compression devices, early ambulation, and chemoprophylaxis to prevent  DVT.  Patient benefited maximally from hospital stay and there were no complications.    Recent vital signs: Patient Vitals for the past 24 hrs:  BP Temp Temp src Pulse Resp SpO2  03/01/16 0842 - - - - - 96 %  03/01/16 0506 (!) 143/57 mmHg 99 F (37.2 C) Oral 71 16 96 %  02/29/16 2055 - - - - - 96 %  02/29/16 2030 (!) 161/66 mmHg 99.3 F (37.4 C) Oral 79 16 95 %  02/29/16 1300 (!) 134/58 mmHg 99 F (37.2 C) - 76 17 98 %     Recent laboratory studies:  Recent Labs  02/29/16 0742 03/01/16 0229  WBC 10.3 13.0*  HGB 12.6* 12.3*  HCT 39.6 39.1  PLT 241 251  NA 135  --   K 3.9  --   CL 105  --   CO2 24  --   BUN 8  --   CREATININE 0.79  --   GLUCOSE 143*  --   CALCIUM 8.1*  --      Discharge Medications:     Medication List    TAKE these medications        albuterol 108 (90 Base) MCG/ACT inhaler  Commonly known as:  PROVENTIL HFA;VENTOLIN HFA  Inhale into the lungs every 6 (six) hours as needed for wheezing or shortness of breath.  amLODipine 5 MG tablet  Commonly known as:  NORVASC  Take 5 mg by mouth every morning.     aspirin 81 MG tablet  Take 81 mg by mouth daily.     aspirin EC 325 MG tablet  Take 1 tablet (325 mg total) by mouth 2 (two) times daily.     budesonide-formoterol 160-4.5 MCG/ACT inhaler  Commonly known as:  SYMBICORT  Inhale 2 puffs into the lungs 2 (two) times daily.     MULTIVITAMIN PO  Take 1 tablet by mouth daily.     oxyCODONE-acetaminophen 5-325 MG tablet  Commonly known as:  ROXICET  Take 1 tablet by mouth every 4 (four) hours as needed.     rosuvastatin 5 MG tablet  Commonly known as:  CRESTOR  Take 5 mg by mouth 3 (three) times a week.     telmisartan 80 MG tablet  Commonly known as:  MICARDIS  Take 80 mg by mouth daily.     tiZANidine 2 MG tablet  Commonly known as:  ZANAFLEX  Take 1 tablet (2 mg total) by mouth every 4 (four) hours as needed for muscle spasms.        Diagnostic Studies: Dg Chest 2  View  02/15/2016  CLINICAL DATA:  Preoperative respiratory examination prior to right knee surgery. EXAM: CHEST  2 VIEW COMPARISON:  01/20/2011 and prior chest radiographs FINDINGS: The cardiomediastinal silhouette is unremarkable. Left basilar scarring again noted. There is no evidence of focal airspace disease, pulmonary edema, suspicious pulmonary nodule/mass, pleural effusion, or pneumothorax. No acute bony abnormalities are identified. IMPRESSION: No active cardiopulmonary disease. Electronically Signed   By: Margarette Canada M.D.   On: 02/15/2016 18:37    Disposition: 01-Home or Self Care      Discharge Instructions    CPM    Complete by:  As directed   Continuous passive motion machine (CPM):      Use the CPM from 0 to 60  for 5 hours per day.      You may increase by 10 degrees per day.  You may break it up into 2 or 3 sessions per day.      Use CPM for 2 weeks or until you are told to stop.     Call MD / Call 911    Complete by:  As directed   If you experience chest pain or shortness of breath, CALL 911 and be transported to the hospital emergency room.  If you develope a fever above 101 F, pus (white drainage) or increased drainage or redness at the wound, or calf pain, call your surgeon's office.     Constipation Prevention    Complete by:  As directed   Drink plenty of fluids.  Prune juice may be helpful.  You may use a stool softener, such as Colace (over the counter) 100 mg twice a day.  Use MiraLax (over the counter) for constipation as needed.     Diet - low sodium heart healthy    Complete by:  As directed      Driving restrictions    Complete by:  As directed   No driving for 2 weeks     Increase activity slowly as tolerated    Complete by:  As directed      Patient may shower    Complete by:  As directed   You may shower without a dressing once there is no drainage.  Do not wash over the wound.  If drainage  remains, cover wound with plastic wrap and then shower.            Follow-up Information    Follow up with Kerin Salen, MD In 2 weeks.   Specialty:  Orthopedic Surgery   Contact information:   [place] [address] [phone]        Signed: Hardin Negus, ERIC R 03/01/2016, 11:02 AM

## 2016-03-01 NOTE — Discharge Instructions (Signed)

## 2016-03-01 NOTE — Progress Notes (Signed)
PATIENT ID: Brendan Joseph  MRN: [id number]  DOB/AGE:  12/03/1945 / 70 y.o.  2 Days Post-Op Procedure(s) (LRB): RIGHT TOTAL KNEE ARTHROPLASTY (Right)    PROGRESS NOTE Subjective: Patient is alert, oriented, no Nausea, no Vomiting, yes passing gas. Taking PO well. Denies SOB, Chest or Calf Pain. Using Incentive Spirometer, PAS in place. Ambulate WBAT with pt walking 20 ft with therapy, CPM 10-40 Patient reports pain as 3/10 at rest and 10/10 with pt .    Objective: Vital signs in last 24 hours: Filed Vitals:   02/29/16 2030 02/29/16 2055 03/01/16 0506 03/01/16 0842  BP: 161/66  143/57   Pulse: 79  71   Temp: 99.3 F (37.4 C)  99 F (37.2 C)   TempSrc: Oral  Oral   Resp: 16  16   SpO2: 95% 96% 96% 96%      Intake/Output from previous day: I/O last 3 completed shifts: In: 1975 [P.O.:600; I.V.:1375] Out: -    Intake/Output this shift:     LABORATORY DATA:  Recent Labs  02/28/16 0646 02/28/16 1037 02/29/16 0742 03/01/16 0229  WBC  --   --  10.3 13.0*  HGB  --   --  12.6* 12.3*  HCT  --   --  39.6 39.1  PLT  --   --  241 251  NA  --   --  135  --   K  --   --  3.9  --   CL  --   --  105  --   CO2  --   --  24  --   BUN  --   --  8  --   CREATININE  --   --  0.79  --   GLUCOSE  --   --  143*  --   GLUCAP 118* 98  --   --   CALCIUM  --   --  8.1*  --     Examination: Neurologically intact Neurovascular intact Sensation intact distally Intact pulses distally Dorsiflexion/Plantar flexion intact Incision: dressing C/D/I No cellulitis present Compartment soft}  Assessment:   2 Days Post-Op Procedure(s) (LRB): RIGHT TOTAL KNEE ARTHROPLASTY (Right) ADDITIONAL DIAGNOSIS: Expected Acute Blood Loss Anemia,   Plan: PT/OT WBAT, CPM 5/hrs day until ROM 0-90 degrees, then D/C CPM DVT Prophylaxis:  SCDx72hrs, ASA 325 mg BID x 2 weeks DISCHARGE PLAN: Skilled Nursing Facility/Rehab DISCHARGE NEEDS: HHPT, CPM, Walker and 3-in-1 comode seat     Remmy Crass  R 03/01/2016, 10:58 AM

## 2016-03-01 NOTE — Progress Notes (Signed)
Physical Therapy Treatment Patient Details Name: Brendan Joseph MRN: [id number] DOB: September 08, 1945 Today's Date: 03/01/2016    History of Present Illness Pt is a 70 y/o male s/p R TKR. PMH including but not limited to HTN, DM, L humerus fx 2009, chronic Hep C, bilateral carpal tunnel.     PT Comments    Pt presented sitting upright in recliner. He continues to report severe pain in R knee and is limited with functional mobility. Pt would continue to benefit from skilled physical therapy services at this time while inpatient and after d/c to address his limitations with strength, ROM and safety and independence with functional mobility.    Follow Up Recommendations  SNF     Equipment Recommendations  Rolling walker with 5" wheels    Recommendations for Other Services       Precautions / Restrictions Precautions Precautions: Fall Restrictions Weight Bearing Restrictions: Yes RLE Weight Bearing: Weight bearing as tolerated    Mobility  Bed Mobility               General bed mobility comments: Pt presented OOB sitting in recliner when PT entered room and requested to return to recliner at end of session  Transfers Overall transfer level: Needs assistance Equipment used: Rolling walker (2 wheeled) Transfers: Sit to/from Stand Sit to Stand: Min guard         General transfer comment: Pt required increased time and verbal cueing for bilateral hand positioning to assist with transfer  Ambulation/Gait Ambulation/Gait assistance: Min guard Ambulation Distance (Feet): 30 Feet Assistive device: Rolling walker (2 wheeled) Gait Pattern/deviations: Step-to pattern;Decreased step length - left;Decreased stance time - right;Decreased weight shift to right Gait velocity: decreased Gait velocity interpretation: Below normal speed for age/gender     Stairs            Wheelchair Mobility    Modified Rankin (Stroke Patients Only)       Balance Overall balance  assessment: Needs assistance Sitting-balance support: No upper extremity supported;Feet supported Sitting balance-Leahy Scale: Fair     Standing balance support: Bilateral upper extremity supported Standing balance-Leahy Scale: Fair                      Cognition Arousal/Alertness: Awake/alert Behavior During Therapy: WFL for tasks assessed/performed Overall Cognitive Status: Within Functional Limits for tasks assessed                      Exercises Total Joint Exercises Ankle Circles/Pumps: AROM;Strengthening;Right;10 reps;Seated Heel Slides: AROM;Strengthening;Right;5 reps;Seated Hip ABduction/ADduction: AROM;Strengthening;Right;10 reps;Seated Long Arc Quad: AROM;Strengthening;Right;5 reps;Other (comment) (limited by pain) Knee Flexion: AROM;Strengthening;Right;5 reps;Other (comment) (limited by pain)    General Comments        Pertinent Vitals/Pain Pain Assessment: 0-10 Pain Score: 7  Pain Location: R knee Pain Descriptors / Indicators: Discomfort Pain Intervention(s): Limited activity within patient's tolerance;Monitored during session;Repositioned    Home Living                      Prior Function            PT Goals (current goals can now be found in the care plan section) Acute Rehab PT Goals Patient Stated Goal: pt would like to return home and return to PLOF PT Goal Formulation: With patient Time For Goal Achievement: 03/06/16 Potential to Achieve Goals: Good Progress towards PT goals: Progressing toward goals    Frequency  7X/week    PT Plan  Current plan remains appropriate    Co-evaluation             End of Session Equipment Utilized During Treatment: Gait belt Activity Tolerance: Patient limited by pain Patient left: in chair;with call bell/phone within reach     Time: 0855-0915 PT Time Calculation (min) (ACUTE ONLY): 20 min  Charges:  $Therapeutic Exercise: 8-22 mins                    G CodesClearnce Sorrel Bita Cartwright 03/07/2016, 11:36 AM Sherie Don, PT, DPT [phone]

## 2016-03-01 NOTE — Clinical Social Work Note (Signed)
Patient to be discharged to [facility] and Rehab. Patient to be transported via EMS. RN report number: [id number] (Room 3205)  Lubertha Sayres, [place] [facility]: [phone] Surgical: [phone]

## 2016-03-01 NOTE — Progress Notes (Addendum)
Called to give report at [facility] and Rehab, Spoke with [employer]

## 2016-03-01 NOTE — Progress Notes (Addendum)
Reviewed discharge papers with full understanding of medications and instructions.  P-Tar notified

## 2016-03-01 NOTE — Clinical Social Work Placement (Signed)
   CLINICAL SOCIAL WORK PLACEMENT  NOTE  Date:  03/01/2016  Patient Details  Name: Brendan Joseph MRN: [id number] Date of Birth: 07-25-1946  Clinical Social Work is seeking post-discharge placement for this patient at the [place] level of care (*CSW will initial, date and re-position this form in  chart as items are completed):  Yes   Patient/family provided with [place] Work Department's list of facilities offering this level of care within the geographic area requested by the patient (or if unable, by the patient's family).  Yes   Patient/family informed of their freedom to choose among providers that offer the needed level of care, that participate in Medicare, Medicaid or managed care program needed by the patient, have an available bed and are willing to accept the patient.  Yes   Patient/family informed of 's ownership interest in [facility] and [facility], as well as of the fact that they are under no obligation to receive care at these facilities.  PASRR submitted to EDS on 02/29/16     PASRR number received on 02/29/16     Existing PASRR number confirmed on       FL2 transmitted to all facilities in geographic area requested by pt/family on 02/29/16     FL2 transmitted to all facilities within larger geographic area on       Patient informed that his/her managed care company has contracts with or will negotiate with certain facilities, including the following:        Yes   Patient/family informed of bed offers received.  Patient chooses bed at [facility]     Physician recommends and patient chooses bed at [facility]    Patient to be transferred to [facility] on 03/01/16.  Patient to be transferred to facility by [facility]     Patient family notified on 03/01/16 of transfer.  Name of family member notified:  Patient     PHYSICIAN Please sign FL2      Additional Comment:    _______________________________________________ Caroline Sauger, LCSW 03/01/2016, 11:29 AM

## 2016-03-03 DIAGNOSIS — J45909 Unspecified asthma, uncomplicated: Secondary | ICD-10-CM | POA: Diagnosis not present

## 2016-03-03 DIAGNOSIS — F322 Major depressive disorder, single episode, severe without psychotic features: Secondary | ICD-10-CM | POA: Diagnosis not present

## 2016-03-03 DIAGNOSIS — I10 Essential (primary) hypertension: Secondary | ICD-10-CM | POA: Diagnosis not present

## 2016-03-03 DIAGNOSIS — B182 Chronic viral hepatitis C: Secondary | ICD-10-CM | POA: Diagnosis not present

## 2016-03-03 DIAGNOSIS — M1990 Unspecified osteoarthritis, unspecified site: Secondary | ICD-10-CM | POA: Diagnosis not present

## 2016-03-03 DIAGNOSIS — Z96659 Presence of unspecified artificial knee joint: Secondary | ICD-10-CM | POA: Diagnosis not present

## 2016-03-03 DIAGNOSIS — E119 Type 2 diabetes mellitus without complications: Secondary | ICD-10-CM | POA: Diagnosis not present

## 2016-03-09 DIAGNOSIS — Z471 Aftercare following joint replacement surgery: Secondary | ICD-10-CM | POA: Diagnosis not present

## 2016-03-09 DIAGNOSIS — F330 Major depressive disorder, recurrent, mild: Secondary | ICD-10-CM | POA: Diagnosis not present

## 2016-03-09 DIAGNOSIS — G5603 Carpal tunnel syndrome, bilateral upper limbs: Secondary | ICD-10-CM | POA: Diagnosis not present

## 2016-03-09 DIAGNOSIS — M6281 Muscle weakness (generalized): Secondary | ICD-10-CM | POA: Diagnosis not present

## 2016-03-09 DIAGNOSIS — B182 Chronic viral hepatitis C: Secondary | ICD-10-CM | POA: Diagnosis not present

## 2016-03-09 DIAGNOSIS — I10 Essential (primary) hypertension: Secondary | ICD-10-CM | POA: Diagnosis not present

## 2016-03-09 DIAGNOSIS — J4530 Mild persistent asthma, uncomplicated: Secondary | ICD-10-CM | POA: Diagnosis not present

## 2016-03-09 DIAGNOSIS — E119 Type 2 diabetes mellitus without complications: Secondary | ICD-10-CM | POA: Diagnosis not present

## 2016-03-09 DIAGNOSIS — M810 Age-related osteoporosis without current pathological fracture: Secondary | ICD-10-CM | POA: Diagnosis not present

## 2016-03-11 DIAGNOSIS — J4530 Mild persistent asthma, uncomplicated: Secondary | ICD-10-CM | POA: Diagnosis not present

## 2016-03-11 DIAGNOSIS — E119 Type 2 diabetes mellitus without complications: Secondary | ICD-10-CM | POA: Diagnosis not present

## 2016-03-11 DIAGNOSIS — M6281 Muscle weakness (generalized): Secondary | ICD-10-CM | POA: Diagnosis not present

## 2016-03-11 DIAGNOSIS — M810 Age-related osteoporosis without current pathological fracture: Secondary | ICD-10-CM | POA: Diagnosis not present

## 2016-03-11 DIAGNOSIS — Z471 Aftercare following joint replacement surgery: Secondary | ICD-10-CM | POA: Diagnosis not present

## 2016-03-11 DIAGNOSIS — I10 Essential (primary) hypertension: Secondary | ICD-10-CM | POA: Diagnosis not present

## 2016-03-12 DIAGNOSIS — M1711 Unilateral primary osteoarthritis, right knee: Secondary | ICD-10-CM | POA: Diagnosis not present

## 2016-03-13 DIAGNOSIS — J4530 Mild persistent asthma, uncomplicated: Secondary | ICD-10-CM | POA: Diagnosis not present

## 2016-03-13 DIAGNOSIS — Z471 Aftercare following joint replacement surgery: Secondary | ICD-10-CM | POA: Diagnosis not present

## 2016-03-13 DIAGNOSIS — I10 Essential (primary) hypertension: Secondary | ICD-10-CM | POA: Diagnosis not present

## 2016-03-13 DIAGNOSIS — M6281 Muscle weakness (generalized): Secondary | ICD-10-CM | POA: Diagnosis not present

## 2016-03-13 DIAGNOSIS — M810 Age-related osteoporosis without current pathological fracture: Secondary | ICD-10-CM | POA: Diagnosis not present

## 2016-03-13 DIAGNOSIS — E119 Type 2 diabetes mellitus without complications: Secondary | ICD-10-CM | POA: Diagnosis not present

## 2016-03-15 DIAGNOSIS — Z471 Aftercare following joint replacement surgery: Secondary | ICD-10-CM | POA: Diagnosis not present

## 2016-03-15 DIAGNOSIS — J4530 Mild persistent asthma, uncomplicated: Secondary | ICD-10-CM | POA: Diagnosis not present

## 2016-03-15 DIAGNOSIS — M6281 Muscle weakness (generalized): Secondary | ICD-10-CM | POA: Diagnosis not present

## 2016-03-15 DIAGNOSIS — M810 Age-related osteoporosis without current pathological fracture: Secondary | ICD-10-CM | POA: Diagnosis not present

## 2016-03-15 DIAGNOSIS — E119 Type 2 diabetes mellitus without complications: Secondary | ICD-10-CM | POA: Diagnosis not present

## 2016-03-15 DIAGNOSIS — I10 Essential (primary) hypertension: Secondary | ICD-10-CM | POA: Diagnosis not present

## 2016-03-18 DIAGNOSIS — Z471 Aftercare following joint replacement surgery: Secondary | ICD-10-CM | POA: Diagnosis not present

## 2016-03-18 DIAGNOSIS — R262 Difficulty in walking, not elsewhere classified: Secondary | ICD-10-CM | POA: Diagnosis not present

## 2016-03-18 DIAGNOSIS — Z96651 Presence of right artificial knee joint: Secondary | ICD-10-CM | POA: Diagnosis not present

## 2016-03-18 DIAGNOSIS — M25561 Pain in right knee: Secondary | ICD-10-CM | POA: Diagnosis not present

## 2016-03-18 DIAGNOSIS — M1711 Unilateral primary osteoarthritis, right knee: Secondary | ICD-10-CM | POA: Diagnosis not present

## 2016-03-20 DIAGNOSIS — R262 Difficulty in walking, not elsewhere classified: Secondary | ICD-10-CM | POA: Diagnosis not present

## 2016-03-20 DIAGNOSIS — Z96651 Presence of right artificial knee joint: Secondary | ICD-10-CM | POA: Diagnosis not present

## 2016-03-20 DIAGNOSIS — M25561 Pain in right knee: Secondary | ICD-10-CM | POA: Diagnosis not present

## 2016-03-20 DIAGNOSIS — Z471 Aftercare following joint replacement surgery: Secondary | ICD-10-CM | POA: Diagnosis not present

## 2016-03-25 DIAGNOSIS — M25561 Pain in right knee: Secondary | ICD-10-CM | POA: Diagnosis not present

## 2016-03-25 DIAGNOSIS — Z471 Aftercare following joint replacement surgery: Secondary | ICD-10-CM | POA: Diagnosis not present

## 2016-03-25 DIAGNOSIS — R262 Difficulty in walking, not elsewhere classified: Secondary | ICD-10-CM | POA: Diagnosis not present

## 2016-03-25 DIAGNOSIS — Z96651 Presence of right artificial knee joint: Secondary | ICD-10-CM | POA: Diagnosis not present

## 2016-03-27 DIAGNOSIS — R262 Difficulty in walking, not elsewhere classified: Secondary | ICD-10-CM | POA: Diagnosis not present

## 2016-03-27 DIAGNOSIS — Z471 Aftercare following joint replacement surgery: Secondary | ICD-10-CM | POA: Diagnosis not present

## 2016-03-27 DIAGNOSIS — Z96651 Presence of right artificial knee joint: Secondary | ICD-10-CM | POA: Diagnosis not present

## 2016-03-27 DIAGNOSIS — M25561 Pain in right knee: Secondary | ICD-10-CM | POA: Diagnosis not present

## 2016-04-01 DIAGNOSIS — Z96651 Presence of right artificial knee joint: Secondary | ICD-10-CM | POA: Diagnosis not present

## 2016-04-01 DIAGNOSIS — M25561 Pain in right knee: Secondary | ICD-10-CM | POA: Diagnosis not present

## 2016-04-01 DIAGNOSIS — R262 Difficulty in walking, not elsewhere classified: Secondary | ICD-10-CM | POA: Diagnosis not present

## 2016-04-01 DIAGNOSIS — Z471 Aftercare following joint replacement surgery: Secondary | ICD-10-CM | POA: Diagnosis not present

## 2016-04-02 DIAGNOSIS — M1711 Unilateral primary osteoarthritis, right knee: Secondary | ICD-10-CM | POA: Diagnosis not present

## 2016-04-03 DIAGNOSIS — Z96651 Presence of right artificial knee joint: Secondary | ICD-10-CM | POA: Diagnosis not present

## 2016-04-03 DIAGNOSIS — R262 Difficulty in walking, not elsewhere classified: Secondary | ICD-10-CM | POA: Diagnosis not present

## 2016-04-03 DIAGNOSIS — M25561 Pain in right knee: Secondary | ICD-10-CM | POA: Diagnosis not present

## 2016-04-03 DIAGNOSIS — Z471 Aftercare following joint replacement surgery: Secondary | ICD-10-CM | POA: Diagnosis not present

## 2016-04-05 DIAGNOSIS — M25561 Pain in right knee: Secondary | ICD-10-CM | POA: Diagnosis not present

## 2016-04-05 DIAGNOSIS — Z471 Aftercare following joint replacement surgery: Secondary | ICD-10-CM | POA: Diagnosis not present

## 2016-04-05 DIAGNOSIS — Z96651 Presence of right artificial knee joint: Secondary | ICD-10-CM | POA: Diagnosis not present

## 2016-04-05 DIAGNOSIS — R262 Difficulty in walking, not elsewhere classified: Secondary | ICD-10-CM | POA: Diagnosis not present

## 2016-04-08 DIAGNOSIS — M25561 Pain in right knee: Secondary | ICD-10-CM | POA: Diagnosis not present

## 2016-04-08 DIAGNOSIS — Z96651 Presence of right artificial knee joint: Secondary | ICD-10-CM | POA: Diagnosis not present

## 2016-04-08 DIAGNOSIS — Z471 Aftercare following joint replacement surgery: Secondary | ICD-10-CM | POA: Diagnosis not present

## 2016-04-08 DIAGNOSIS — R262 Difficulty in walking, not elsewhere classified: Secondary | ICD-10-CM | POA: Diagnosis not present

## 2016-04-10 DIAGNOSIS — Z96651 Presence of right artificial knee joint: Secondary | ICD-10-CM | POA: Diagnosis not present

## 2016-04-10 DIAGNOSIS — R262 Difficulty in walking, not elsewhere classified: Secondary | ICD-10-CM | POA: Diagnosis not present

## 2016-04-10 DIAGNOSIS — Z471 Aftercare following joint replacement surgery: Secondary | ICD-10-CM | POA: Diagnosis not present

## 2016-04-10 DIAGNOSIS — M25561 Pain in right knee: Secondary | ICD-10-CM | POA: Diagnosis not present

## 2016-04-15 DIAGNOSIS — R262 Difficulty in walking, not elsewhere classified: Secondary | ICD-10-CM | POA: Diagnosis not present

## 2016-04-15 DIAGNOSIS — Z471 Aftercare following joint replacement surgery: Secondary | ICD-10-CM | POA: Diagnosis not present

## 2016-04-15 DIAGNOSIS — M25561 Pain in right knee: Secondary | ICD-10-CM | POA: Diagnosis not present

## 2016-04-15 DIAGNOSIS — Z96651 Presence of right artificial knee joint: Secondary | ICD-10-CM | POA: Diagnosis not present

## 2016-04-17 DIAGNOSIS — Z471 Aftercare following joint replacement surgery: Secondary | ICD-10-CM | POA: Diagnosis not present

## 2016-04-17 DIAGNOSIS — Z96651 Presence of right artificial knee joint: Secondary | ICD-10-CM | POA: Diagnosis not present

## 2016-04-17 DIAGNOSIS — R262 Difficulty in walking, not elsewhere classified: Secondary | ICD-10-CM | POA: Diagnosis not present

## 2016-04-17 DIAGNOSIS — M25561 Pain in right knee: Secondary | ICD-10-CM | POA: Diagnosis not present

## 2016-04-24 DIAGNOSIS — R262 Difficulty in walking, not elsewhere classified: Secondary | ICD-10-CM | POA: Diagnosis not present

## 2016-04-24 DIAGNOSIS — Z96651 Presence of right artificial knee joint: Secondary | ICD-10-CM | POA: Diagnosis not present

## 2016-04-24 DIAGNOSIS — Z471 Aftercare following joint replacement surgery: Secondary | ICD-10-CM | POA: Diagnosis not present

## 2016-04-24 DIAGNOSIS — M25561 Pain in right knee: Secondary | ICD-10-CM | POA: Diagnosis not present

## 2016-04-26 DIAGNOSIS — R262 Difficulty in walking, not elsewhere classified: Secondary | ICD-10-CM | POA: Diagnosis not present

## 2016-04-26 DIAGNOSIS — M25561 Pain in right knee: Secondary | ICD-10-CM | POA: Diagnosis not present

## 2016-04-26 DIAGNOSIS — Z96651 Presence of right artificial knee joint: Secondary | ICD-10-CM | POA: Diagnosis not present

## 2016-04-26 DIAGNOSIS — Z471 Aftercare following joint replacement surgery: Secondary | ICD-10-CM | POA: Diagnosis not present

## 2016-04-29 DIAGNOSIS — M25561 Pain in right knee: Secondary | ICD-10-CM | POA: Diagnosis not present

## 2016-04-29 DIAGNOSIS — Z96651 Presence of right artificial knee joint: Secondary | ICD-10-CM | POA: Diagnosis not present

## 2016-04-29 DIAGNOSIS — R262 Difficulty in walking, not elsewhere classified: Secondary | ICD-10-CM | POA: Diagnosis not present

## 2016-04-29 DIAGNOSIS — Z471 Aftercare following joint replacement surgery: Secondary | ICD-10-CM | POA: Diagnosis not present

## 2016-05-01 DIAGNOSIS — R262 Difficulty in walking, not elsewhere classified: Secondary | ICD-10-CM | POA: Diagnosis not present

## 2016-05-01 DIAGNOSIS — M25561 Pain in right knee: Secondary | ICD-10-CM | POA: Diagnosis not present

## 2016-05-01 DIAGNOSIS — Z471 Aftercare following joint replacement surgery: Secondary | ICD-10-CM | POA: Diagnosis not present

## 2016-05-01 DIAGNOSIS — Z96651 Presence of right artificial knee joint: Secondary | ICD-10-CM | POA: Diagnosis not present

## 2016-05-06 DIAGNOSIS — Z96651 Presence of right artificial knee joint: Secondary | ICD-10-CM | POA: Diagnosis not present

## 2016-05-06 DIAGNOSIS — Z471 Aftercare following joint replacement surgery: Secondary | ICD-10-CM | POA: Diagnosis not present

## 2016-05-06 DIAGNOSIS — M25561 Pain in right knee: Secondary | ICD-10-CM | POA: Diagnosis not present

## 2016-05-06 DIAGNOSIS — R262 Difficulty in walking, not elsewhere classified: Secondary | ICD-10-CM | POA: Diagnosis not present

## 2016-05-08 DIAGNOSIS — M25561 Pain in right knee: Secondary | ICD-10-CM | POA: Diagnosis not present

## 2016-05-08 DIAGNOSIS — Z96651 Presence of right artificial knee joint: Secondary | ICD-10-CM | POA: Diagnosis not present

## 2016-05-08 DIAGNOSIS — R262 Difficulty in walking, not elsewhere classified: Secondary | ICD-10-CM | POA: Diagnosis not present

## 2016-05-08 DIAGNOSIS — Z471 Aftercare following joint replacement surgery: Secondary | ICD-10-CM | POA: Diagnosis not present

## 2016-05-13 DIAGNOSIS — R262 Difficulty in walking, not elsewhere classified: Secondary | ICD-10-CM | POA: Diagnosis not present

## 2016-05-13 DIAGNOSIS — Z471 Aftercare following joint replacement surgery: Secondary | ICD-10-CM | POA: Diagnosis not present

## 2016-05-13 DIAGNOSIS — Z96651 Presence of right artificial knee joint: Secondary | ICD-10-CM | POA: Diagnosis not present

## 2016-05-13 DIAGNOSIS — M25561 Pain in right knee: Secondary | ICD-10-CM | POA: Diagnosis not present

## 2016-05-15 DIAGNOSIS — R262 Difficulty in walking, not elsewhere classified: Secondary | ICD-10-CM | POA: Diagnosis not present

## 2016-05-15 DIAGNOSIS — Z96651 Presence of right artificial knee joint: Secondary | ICD-10-CM | POA: Diagnosis not present

## 2016-05-15 DIAGNOSIS — Z471 Aftercare following joint replacement surgery: Secondary | ICD-10-CM | POA: Diagnosis not present

## 2016-05-15 DIAGNOSIS — M25561 Pain in right knee: Secondary | ICD-10-CM | POA: Diagnosis not present

## 2016-06-06 DIAGNOSIS — Z23 Encounter for immunization: Secondary | ICD-10-CM | POA: Diagnosis not present

## 2016-08-26 DIAGNOSIS — E7800 Pure hypercholesterolemia, unspecified: Secondary | ICD-10-CM | POA: Diagnosis not present

## 2016-08-26 DIAGNOSIS — Z6832 Body mass index (BMI) 32.0-32.9, adult: Secondary | ICD-10-CM | POA: Diagnosis not present

## 2016-08-26 DIAGNOSIS — J4540 Moderate persistent asthma, uncomplicated: Secondary | ICD-10-CM | POA: Diagnosis not present

## 2016-08-26 DIAGNOSIS — Z125 Encounter for screening for malignant neoplasm of prostate: Secondary | ICD-10-CM | POA: Diagnosis not present

## 2016-08-26 DIAGNOSIS — E119 Type 2 diabetes mellitus without complications: Secondary | ICD-10-CM | POA: Diagnosis not present

## 2016-08-26 DIAGNOSIS — I10 Essential (primary) hypertension: Secondary | ICD-10-CM | POA: Diagnosis not present

## 2016-08-26 DIAGNOSIS — Z1389 Encounter for screening for other disorder: Secondary | ICD-10-CM | POA: Diagnosis not present

## 2016-08-26 DIAGNOSIS — E669 Obesity, unspecified: Secondary | ICD-10-CM | POA: Diagnosis not present

## 2016-08-26 DIAGNOSIS — Z0000 Encounter for general adult medical examination without abnormal findings: Secondary | ICD-10-CM | POA: Diagnosis not present

## 2016-10-07 DIAGNOSIS — E119 Type 2 diabetes mellitus without complications: Secondary | ICD-10-CM | POA: Diagnosis not present

## 2016-10-07 DIAGNOSIS — H02831 Dermatochalasis of right upper eyelid: Secondary | ICD-10-CM | POA: Diagnosis not present

## 2016-10-07 DIAGNOSIS — H02052 Trichiasis without entropian right lower eyelid: Secondary | ICD-10-CM | POA: Diagnosis not present

## 2016-10-07 DIAGNOSIS — H02834 Dermatochalasis of left upper eyelid: Secondary | ICD-10-CM | POA: Diagnosis not present

## 2016-10-07 DIAGNOSIS — H2513 Age-related nuclear cataract, bilateral: Secondary | ICD-10-CM | POA: Diagnosis not present

## 2016-12-24 DIAGNOSIS — M67912 Unspecified disorder of synovium and tendon, left shoulder: Secondary | ICD-10-CM | POA: Diagnosis not present

## 2017-01-20 DIAGNOSIS — M67912 Unspecified disorder of synovium and tendon, left shoulder: Secondary | ICD-10-CM | POA: Diagnosis not present

## 2017-01-23 ENCOUNTER — Other Ambulatory Visit: Payer: Self-pay | Admitting: Orthopedic Surgery

## 2017-01-23 DIAGNOSIS — M67912 Unspecified disorder of synovium and tendon, left shoulder: Secondary | ICD-10-CM

## 2017-02-02 ENCOUNTER — Ambulatory Visit
Admission: RE | Admit: 2017-02-02 | Discharge: 2017-02-02 | Disposition: A | Discharge status: Home / Self Care | Payer: Medicare Other | Source: Ambulatory Visit | Attending: Orthopedic Surgery | Admitting: Orthopedic Surgery

## 2017-02-02 DIAGNOSIS — M67912 Unspecified disorder of synovium and tendon, left shoulder: Secondary | ICD-10-CM

## 2017-02-02 DIAGNOSIS — M19012 Primary osteoarthritis, left shoulder: Secondary | ICD-10-CM | POA: Diagnosis not present

## 2017-02-04 DIAGNOSIS — M75112 Incomplete rotator cuff tear or rupture of left shoulder, not specified as traumatic: Secondary | ICD-10-CM | POA: Diagnosis not present

## 2017-02-04 DIAGNOSIS — M67912 Unspecified disorder of synovium and tendon, left shoulder: Secondary | ICD-10-CM | POA: Diagnosis not present

## 2017-02-04 DIAGNOSIS — S43432A Superior glenoid labrum lesion of left shoulder, initial encounter: Secondary | ICD-10-CM | POA: Diagnosis not present

## 2017-02-04 DIAGNOSIS — M24812 Other specific joint derangements of left shoulder, not elsewhere classified: Secondary | ICD-10-CM | POA: Diagnosis not present

## 2017-02-12 DIAGNOSIS — G8918 Other acute postprocedural pain: Secondary | ICD-10-CM | POA: Diagnosis not present

## 2017-02-12 DIAGNOSIS — Y999 Unspecified external cause status: Secondary | ICD-10-CM | POA: Diagnosis not present

## 2017-02-12 DIAGNOSIS — M7542 Impingement syndrome of left shoulder: Secondary | ICD-10-CM | POA: Diagnosis not present

## 2017-02-12 DIAGNOSIS — S43432A Superior glenoid labrum lesion of left shoulder, initial encounter: Secondary | ICD-10-CM | POA: Diagnosis not present

## 2017-02-12 DIAGNOSIS — M7522 Bicipital tendinitis, left shoulder: Secondary | ICD-10-CM | POA: Diagnosis not present

## 2017-02-12 DIAGNOSIS — M19012 Primary osteoarthritis, left shoulder: Secondary | ICD-10-CM | POA: Diagnosis not present

## 2017-02-17 DIAGNOSIS — Z9889 Other specified postprocedural states: Secondary | ICD-10-CM | POA: Diagnosis not present

## 2017-02-21 DIAGNOSIS — M25612 Stiffness of left shoulder, not elsewhere classified: Secondary | ICD-10-CM | POA: Diagnosis not present

## 2017-02-21 DIAGNOSIS — M25512 Pain in left shoulder: Secondary | ICD-10-CM | POA: Diagnosis not present

## 2017-02-25 DIAGNOSIS — I10 Essential (primary) hypertension: Secondary | ICD-10-CM | POA: Diagnosis not present

## 2017-02-25 DIAGNOSIS — E119 Type 2 diabetes mellitus without complications: Secondary | ICD-10-CM | POA: Diagnosis not present

## 2017-02-26 DIAGNOSIS — M25512 Pain in left shoulder: Secondary | ICD-10-CM | POA: Diagnosis not present

## 2017-02-26 DIAGNOSIS — L814 Other melanin hyperpigmentation: Secondary | ICD-10-CM | POA: Diagnosis not present

## 2017-02-26 DIAGNOSIS — D2372 Other benign neoplasm of skin of left lower limb, including hip: Secondary | ICD-10-CM | POA: Diagnosis not present

## 2017-02-26 DIAGNOSIS — L821 Other seborrheic keratosis: Secondary | ICD-10-CM | POA: Diagnosis not present

## 2017-02-26 DIAGNOSIS — L570 Actinic keratosis: Secondary | ICD-10-CM | POA: Diagnosis not present

## 2017-02-26 DIAGNOSIS — D225 Melanocytic nevi of trunk: Secondary | ICD-10-CM | POA: Diagnosis not present

## 2017-02-26 DIAGNOSIS — D2271 Melanocytic nevi of right lower limb, including hip: Secondary | ICD-10-CM | POA: Diagnosis not present

## 2017-02-26 DIAGNOSIS — Z85828 Personal history of other malignant neoplasm of skin: Secondary | ICD-10-CM | POA: Diagnosis not present

## 2017-02-26 DIAGNOSIS — D2272 Melanocytic nevi of left lower limb, including hip: Secondary | ICD-10-CM | POA: Diagnosis not present

## 2017-02-26 DIAGNOSIS — D1801 Hemangioma of skin and subcutaneous tissue: Secondary | ICD-10-CM | POA: Diagnosis not present

## 2017-02-26 DIAGNOSIS — D2261 Melanocytic nevi of right upper limb, including shoulder: Secondary | ICD-10-CM | POA: Diagnosis not present

## 2017-02-26 DIAGNOSIS — L905 Scar conditions and fibrosis of skin: Secondary | ICD-10-CM | POA: Diagnosis not present

## 2017-02-26 DIAGNOSIS — M25612 Stiffness of left shoulder, not elsewhere classified: Secondary | ICD-10-CM | POA: Diagnosis not present

## 2017-02-26 DIAGNOSIS — L820 Inflamed seborrheic keratosis: Secondary | ICD-10-CM | POA: Diagnosis not present

## 2017-02-28 DIAGNOSIS — M25512 Pain in left shoulder: Secondary | ICD-10-CM | POA: Diagnosis not present

## 2017-02-28 DIAGNOSIS — M25612 Stiffness of left shoulder, not elsewhere classified: Secondary | ICD-10-CM | POA: Diagnosis not present

## 2017-03-03 DIAGNOSIS — Z9889 Other specified postprocedural states: Secondary | ICD-10-CM | POA: Diagnosis not present

## 2017-03-03 DIAGNOSIS — M25512 Pain in left shoulder: Secondary | ICD-10-CM | POA: Diagnosis not present

## 2017-03-03 DIAGNOSIS — M25612 Stiffness of left shoulder, not elsewhere classified: Secondary | ICD-10-CM | POA: Diagnosis not present

## 2017-03-05 DIAGNOSIS — M25512 Pain in left shoulder: Secondary | ICD-10-CM | POA: Diagnosis not present

## 2017-03-05 DIAGNOSIS — M25612 Stiffness of left shoulder, not elsewhere classified: Secondary | ICD-10-CM | POA: Diagnosis not present

## 2017-03-07 DIAGNOSIS — M25612 Stiffness of left shoulder, not elsewhere classified: Secondary | ICD-10-CM | POA: Diagnosis not present

## 2017-03-07 DIAGNOSIS — M25512 Pain in left shoulder: Secondary | ICD-10-CM | POA: Diagnosis not present

## 2017-03-10 DIAGNOSIS — M25512 Pain in left shoulder: Secondary | ICD-10-CM | POA: Diagnosis not present

## 2017-03-10 DIAGNOSIS — M25612 Stiffness of left shoulder, not elsewhere classified: Secondary | ICD-10-CM | POA: Diagnosis not present

## 2017-03-13 DIAGNOSIS — M25612 Stiffness of left shoulder, not elsewhere classified: Secondary | ICD-10-CM | POA: Diagnosis not present

## 2017-03-13 DIAGNOSIS — M25512 Pain in left shoulder: Secondary | ICD-10-CM | POA: Diagnosis not present

## 2017-03-14 DIAGNOSIS — M25612 Stiffness of left shoulder, not elsewhere classified: Secondary | ICD-10-CM | POA: Diagnosis not present

## 2017-03-14 DIAGNOSIS — M25512 Pain in left shoulder: Secondary | ICD-10-CM | POA: Diagnosis not present

## 2017-03-17 DIAGNOSIS — M25512 Pain in left shoulder: Secondary | ICD-10-CM | POA: Diagnosis not present

## 2017-03-17 DIAGNOSIS — M25612 Stiffness of left shoulder, not elsewhere classified: Secondary | ICD-10-CM | POA: Diagnosis not present

## 2017-03-19 DIAGNOSIS — M25612 Stiffness of left shoulder, not elsewhere classified: Secondary | ICD-10-CM | POA: Diagnosis not present

## 2017-03-19 DIAGNOSIS — M25512 Pain in left shoulder: Secondary | ICD-10-CM | POA: Diagnosis not present

## 2017-03-24 DIAGNOSIS — M25512 Pain in left shoulder: Secondary | ICD-10-CM | POA: Diagnosis not present

## 2017-03-24 DIAGNOSIS — M25612 Stiffness of left shoulder, not elsewhere classified: Secondary | ICD-10-CM | POA: Diagnosis not present

## 2017-03-25 DIAGNOSIS — Z9889 Other specified postprocedural states: Secondary | ICD-10-CM | POA: Diagnosis not present

## 2017-03-27 DIAGNOSIS — M25612 Stiffness of left shoulder, not elsewhere classified: Secondary | ICD-10-CM | POA: Diagnosis not present

## 2017-03-27 DIAGNOSIS — M25512 Pain in left shoulder: Secondary | ICD-10-CM | POA: Diagnosis not present

## 2017-04-01 DIAGNOSIS — M25612 Stiffness of left shoulder, not elsewhere classified: Secondary | ICD-10-CM | POA: Diagnosis not present

## 2017-04-01 DIAGNOSIS — M25512 Pain in left shoulder: Secondary | ICD-10-CM | POA: Diagnosis not present

## 2017-04-03 DIAGNOSIS — M25512 Pain in left shoulder: Secondary | ICD-10-CM | POA: Diagnosis not present

## 2017-04-03 DIAGNOSIS — M25612 Stiffness of left shoulder, not elsewhere classified: Secondary | ICD-10-CM | POA: Diagnosis not present

## 2017-04-04 DIAGNOSIS — M25612 Stiffness of left shoulder, not elsewhere classified: Secondary | ICD-10-CM | POA: Diagnosis not present

## 2017-04-04 DIAGNOSIS — M25512 Pain in left shoulder: Secondary | ICD-10-CM | POA: Diagnosis not present

## 2017-04-07 DIAGNOSIS — M25612 Stiffness of left shoulder, not elsewhere classified: Secondary | ICD-10-CM | POA: Diagnosis not present

## 2017-04-07 DIAGNOSIS — M25512 Pain in left shoulder: Secondary | ICD-10-CM | POA: Diagnosis not present

## 2017-04-09 DIAGNOSIS — M25612 Stiffness of left shoulder, not elsewhere classified: Secondary | ICD-10-CM | POA: Diagnosis not present

## 2017-04-09 DIAGNOSIS — M25512 Pain in left shoulder: Secondary | ICD-10-CM | POA: Diagnosis not present

## 2017-04-11 DIAGNOSIS — M25612 Stiffness of left shoulder, not elsewhere classified: Secondary | ICD-10-CM | POA: Diagnosis not present

## 2017-04-11 DIAGNOSIS — M25512 Pain in left shoulder: Secondary | ICD-10-CM | POA: Diagnosis not present

## 2017-04-14 DIAGNOSIS — M25512 Pain in left shoulder: Secondary | ICD-10-CM | POA: Diagnosis not present

## 2017-04-14 DIAGNOSIS — M25612 Stiffness of left shoulder, not elsewhere classified: Secondary | ICD-10-CM | POA: Diagnosis not present

## 2017-04-16 DIAGNOSIS — M25612 Stiffness of left shoulder, not elsewhere classified: Secondary | ICD-10-CM | POA: Diagnosis not present

## 2017-04-16 DIAGNOSIS — M25512 Pain in left shoulder: Secondary | ICD-10-CM | POA: Diagnosis not present

## 2017-04-18 DIAGNOSIS — M25512 Pain in left shoulder: Secondary | ICD-10-CM | POA: Diagnosis not present

## 2017-04-18 DIAGNOSIS — M25612 Stiffness of left shoulder, not elsewhere classified: Secondary | ICD-10-CM | POA: Diagnosis not present

## 2017-04-22 DIAGNOSIS — M25612 Stiffness of left shoulder, not elsewhere classified: Secondary | ICD-10-CM | POA: Diagnosis not present

## 2017-04-22 DIAGNOSIS — M25512 Pain in left shoulder: Secondary | ICD-10-CM | POA: Diagnosis not present

## 2017-04-24 DIAGNOSIS — M25612 Stiffness of left shoulder, not elsewhere classified: Secondary | ICD-10-CM | POA: Diagnosis not present

## 2017-04-24 DIAGNOSIS — Z9889 Other specified postprocedural states: Secondary | ICD-10-CM | POA: Diagnosis not present

## 2017-04-24 DIAGNOSIS — M25512 Pain in left shoulder: Secondary | ICD-10-CM | POA: Diagnosis not present

## 2017-06-03 DIAGNOSIS — M25512 Pain in left shoulder: Secondary | ICD-10-CM | POA: Diagnosis not present

## 2017-06-18 DIAGNOSIS — Z23 Encounter for immunization: Secondary | ICD-10-CM | POA: Diagnosis not present

## 2017-07-01 DIAGNOSIS — M75112 Incomplete rotator cuff tear or rupture of left shoulder, not specified as traumatic: Secondary | ICD-10-CM | POA: Diagnosis not present

## 2017-07-01 DIAGNOSIS — M25512 Pain in left shoulder: Secondary | ICD-10-CM | POA: Diagnosis not present

## 2017-07-03 ENCOUNTER — Other Ambulatory Visit: Payer: Self-pay | Admitting: Orthopedic Surgery

## 2017-07-03 DIAGNOSIS — M25512 Pain in left shoulder: Secondary | ICD-10-CM

## 2017-07-22 ENCOUNTER — Ambulatory Visit
Admission: RE | Admit: 2017-07-22 | Discharge: 2017-07-22 | Disposition: A | Discharge status: Home / Self Care | Payer: Medicare Other | Source: Ambulatory Visit | Attending: Orthopedic Surgery | Admitting: Orthopedic Surgery

## 2017-07-22 DIAGNOSIS — M25512 Pain in left shoulder: Secondary | ICD-10-CM | POA: Diagnosis not present

## 2017-07-22 DIAGNOSIS — M19012 Primary osteoarthritis, left shoulder: Secondary | ICD-10-CM | POA: Diagnosis not present

## 2017-07-22 MED ORDER — IOPAMIDOL (ISOVUE-M 200) INJECTION 41%
20.0000 mL | Freq: Once | INTRAMUSCULAR | Status: AC
  Administered 2017-07-22: 20 mL via INTRA_ARTICULAR

## 2017-08-05 DIAGNOSIS — M67912 Unspecified disorder of synovium and tendon, left shoulder: Secondary | ICD-10-CM | POA: Diagnosis not present

## 2017-08-05 DIAGNOSIS — M19012 Primary osteoarthritis, left shoulder: Secondary | ICD-10-CM | POA: Diagnosis not present

## 2017-09-01 DIAGNOSIS — Z1389 Encounter for screening for other disorder: Secondary | ICD-10-CM | POA: Diagnosis not present

## 2017-09-01 DIAGNOSIS — Z0000 Encounter for general adult medical examination without abnormal findings: Secondary | ICD-10-CM | POA: Diagnosis not present

## 2017-09-23 DIAGNOSIS — H2513 Age-related nuclear cataract, bilateral: Secondary | ICD-10-CM | POA: Diagnosis not present

## 2017-09-23 DIAGNOSIS — E119 Type 2 diabetes mellitus without complications: Secondary | ICD-10-CM | POA: Diagnosis not present

## 2018-02-02 DIAGNOSIS — Z23 Encounter for immunization: Secondary | ICD-10-CM | POA: Diagnosis not present

## 2018-02-02 DIAGNOSIS — E1165 Type 2 diabetes mellitus with hyperglycemia: Secondary | ICD-10-CM | POA: Diagnosis not present

## 2018-02-02 DIAGNOSIS — J4540 Moderate persistent asthma, uncomplicated: Secondary | ICD-10-CM | POA: Diagnosis not present

## 2018-02-02 DIAGNOSIS — K219 Gastro-esophageal reflux disease without esophagitis: Secondary | ICD-10-CM | POA: Diagnosis not present

## 2018-02-02 DIAGNOSIS — E1169 Type 2 diabetes mellitus with other specified complication: Secondary | ICD-10-CM | POA: Diagnosis not present

## 2018-02-02 DIAGNOSIS — I10 Essential (primary) hypertension: Secondary | ICD-10-CM | POA: Diagnosis not present

## 2018-02-26 DIAGNOSIS — D225 Melanocytic nevi of trunk: Secondary | ICD-10-CM | POA: Diagnosis not present

## 2018-02-26 DIAGNOSIS — D1801 Hemangioma of skin and subcutaneous tissue: Secondary | ICD-10-CM | POA: Diagnosis not present

## 2018-02-26 DIAGNOSIS — Z85828 Personal history of other malignant neoplasm of skin: Secondary | ICD-10-CM | POA: Diagnosis not present

## 2018-02-26 DIAGNOSIS — D2372 Other benign neoplasm of skin of left lower limb, including hip: Secondary | ICD-10-CM | POA: Diagnosis not present

## 2018-02-26 DIAGNOSIS — L821 Other seborrheic keratosis: Secondary | ICD-10-CM | POA: Diagnosis not present

## 2018-02-26 DIAGNOSIS — L570 Actinic keratosis: Secondary | ICD-10-CM | POA: Diagnosis not present

## 2018-02-26 DIAGNOSIS — D485 Neoplasm of uncertain behavior of skin: Secondary | ICD-10-CM | POA: Diagnosis not present

## 2018-02-26 DIAGNOSIS — L430 Hypertrophic lichen planus: Secondary | ICD-10-CM | POA: Diagnosis not present

## 2018-06-09 DIAGNOSIS — Z23 Encounter for immunization: Secondary | ICD-10-CM | POA: Diagnosis not present

## 2018-09-07 ENCOUNTER — Other Ambulatory Visit: Payer: Self-pay | Admitting: Internal Medicine

## 2018-09-07 ENCOUNTER — Ambulatory Visit
Admission: RE | Admit: 2018-09-07 | Discharge: 2018-09-07 | Disposition: A | Discharge status: Home / Self Care | Payer: Medicare Other | Source: Ambulatory Visit | Attending: Internal Medicine | Admitting: Internal Medicine

## 2018-09-07 DIAGNOSIS — F325 Major depressive disorder, single episode, in full remission: Secondary | ICD-10-CM | POA: Diagnosis not present

## 2018-09-07 DIAGNOSIS — Z1389 Encounter for screening for other disorder: Secondary | ICD-10-CM | POA: Diagnosis not present

## 2018-09-07 DIAGNOSIS — R0789 Other chest pain: Secondary | ICD-10-CM | POA: Diagnosis not present

## 2018-09-07 DIAGNOSIS — R079 Chest pain, unspecified: Secondary | ICD-10-CM

## 2018-09-07 DIAGNOSIS — Z0000 Encounter for general adult medical examination without abnormal findings: Secondary | ICD-10-CM | POA: Diagnosis not present

## 2018-09-07 DIAGNOSIS — R1012 Left upper quadrant pain: Secondary | ICD-10-CM | POA: Diagnosis not present

## 2018-09-14 ENCOUNTER — Other Ambulatory Visit: Payer: Self-pay | Admitting: Internal Medicine

## 2018-09-14 DIAGNOSIS — R1012 Left upper quadrant pain: Secondary | ICD-10-CM

## 2018-09-18 ENCOUNTER — Ambulatory Visit
Admission: RE | Admit: 2018-09-18 | Discharge: 2018-09-18 | Disposition: A | Discharge status: Home / Self Care | Payer: Medicare Other | Source: Ambulatory Visit | Attending: Internal Medicine | Admitting: Internal Medicine

## 2018-09-18 DIAGNOSIS — N281 Cyst of kidney, acquired: Secondary | ICD-10-CM | POA: Diagnosis not present

## 2018-09-18 DIAGNOSIS — K760 Fatty (change of) liver, not elsewhere classified: Secondary | ICD-10-CM | POA: Diagnosis not present

## 2018-09-18 DIAGNOSIS — R1012 Left upper quadrant pain: Secondary | ICD-10-CM

## 2018-09-18 MED ORDER — IOPAMIDOL (ISOVUE-300) INJECTION 61%
125.0000 mL | Freq: Once | INTRAVENOUS | Status: AC | PRN
  Administered 2018-09-18: 125 mL via INTRAVENOUS

## 2018-10-14 DIAGNOSIS — E119 Type 2 diabetes mellitus without complications: Secondary | ICD-10-CM | POA: Diagnosis not present

## 2018-10-14 DIAGNOSIS — H5213 Myopia, bilateral: Secondary | ICD-10-CM | POA: Diagnosis not present

## 2018-10-14 DIAGNOSIS — H2513 Age-related nuclear cataract, bilateral: Secondary | ICD-10-CM | POA: Diagnosis not present

## 2018-10-14 DIAGNOSIS — H524 Presbyopia: Secondary | ICD-10-CM | POA: Diagnosis not present

## 2019-03-08 DIAGNOSIS — D2272 Melanocytic nevi of left lower limb, including hip: Secondary | ICD-10-CM | POA: Diagnosis not present

## 2019-03-08 DIAGNOSIS — D2262 Melanocytic nevi of left upper limb, including shoulder: Secondary | ICD-10-CM | POA: Diagnosis not present

## 2019-03-08 DIAGNOSIS — L570 Actinic keratosis: Secondary | ICD-10-CM | POA: Diagnosis not present

## 2019-03-08 DIAGNOSIS — D2271 Melanocytic nevi of right lower limb, including hip: Secondary | ICD-10-CM | POA: Diagnosis not present

## 2019-03-08 DIAGNOSIS — D225 Melanocytic nevi of trunk: Secondary | ICD-10-CM | POA: Diagnosis not present

## 2019-03-08 DIAGNOSIS — D2372 Other benign neoplasm of skin of left lower limb, including hip: Secondary | ICD-10-CM | POA: Diagnosis not present

## 2019-03-08 DIAGNOSIS — D485 Neoplasm of uncertain behavior of skin: Secondary | ICD-10-CM | POA: Diagnosis not present

## 2019-03-08 DIAGNOSIS — L814 Other melanin hyperpigmentation: Secondary | ICD-10-CM | POA: Diagnosis not present

## 2019-03-08 DIAGNOSIS — L821 Other seborrheic keratosis: Secondary | ICD-10-CM | POA: Diagnosis not present

## 2019-03-08 DIAGNOSIS — D2261 Melanocytic nevi of right upper limb, including shoulder: Secondary | ICD-10-CM | POA: Diagnosis not present

## 2019-03-08 DIAGNOSIS — Z85828 Personal history of other malignant neoplasm of skin: Secondary | ICD-10-CM | POA: Diagnosis not present

## 2019-03-08 DIAGNOSIS — D1801 Hemangioma of skin and subcutaneous tissue: Secondary | ICD-10-CM | POA: Diagnosis not present

## 2019-03-09 DIAGNOSIS — E1169 Type 2 diabetes mellitus with other specified complication: Secondary | ICD-10-CM | POA: Diagnosis not present

## 2019-03-09 DIAGNOSIS — I10 Essential (primary) hypertension: Secondary | ICD-10-CM | POA: Diagnosis not present

## 2019-03-09 DIAGNOSIS — J4540 Moderate persistent asthma, uncomplicated: Secondary | ICD-10-CM | POA: Diagnosis not present

## 2019-03-09 DIAGNOSIS — R0602 Shortness of breath: Secondary | ICD-10-CM | POA: Diagnosis not present

## 2019-03-09 DIAGNOSIS — E7800 Pure hypercholesterolemia, unspecified: Secondary | ICD-10-CM | POA: Diagnosis not present

## 2019-04-08 DIAGNOSIS — E1169 Type 2 diabetes mellitus with other specified complication: Secondary | ICD-10-CM | POA: Diagnosis not present

## 2019-04-08 DIAGNOSIS — Z7984 Long term (current) use of oral hypoglycemic drugs: Secondary | ICD-10-CM | POA: Diagnosis not present

## 2019-04-08 DIAGNOSIS — I10 Essential (primary) hypertension: Secondary | ICD-10-CM | POA: Diagnosis not present

## 2019-04-25 IMAGING — MR MR SHOULDER*L* W/O CM
5 series · 33 of 40 positions shown · non-contrast
Comparison: None.

CLINICAL DATA: Left shoulder pain for 5 months.

EXAM:
MRI OF THE LEFT SHOULDER WITHOUT CONTRAST
TECHNIQUE: Multiplanar, multisequence MR imaging of the shoulder was performed.
No intravenous contrast was administered.

[Series 3: T2 fat-sat · axial · 4.0mm · 0.59mm/px · z∈[-64,+32]mm · 8 of 23 slices shown (1 of 3)]
[im 1/23]
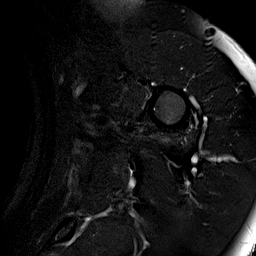
[im 4/23]
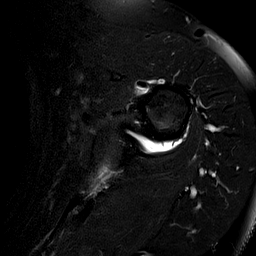
[im 7/23]
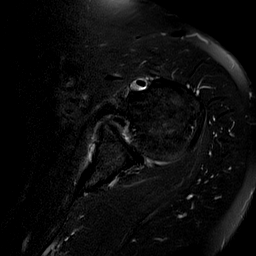
[im 10/23]
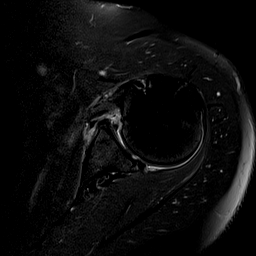
[im 13/23]
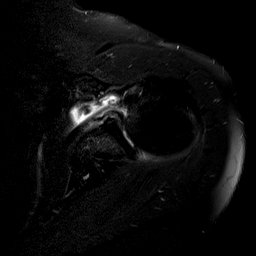
[im 16/23]
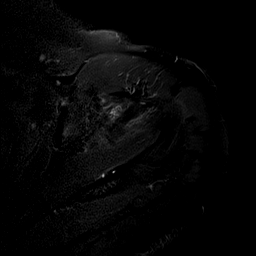
[im 19/23]
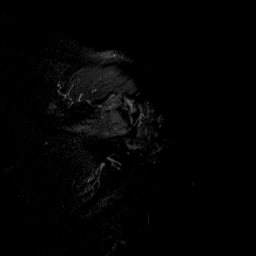
[im 23/23]
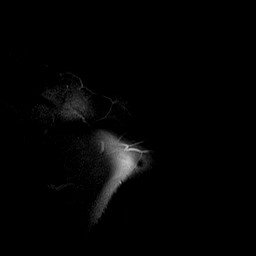

[Series 4: T2 fat-sat · oblique · 4.0mm · 0.55mm/px · 9 of 23 slices shown (2 of 3)]
[im 1/23]
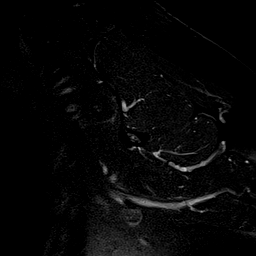
[im 3/23]
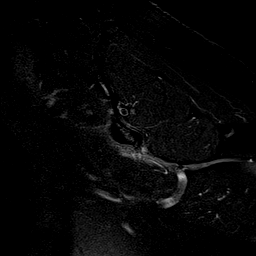
[im 6/23]
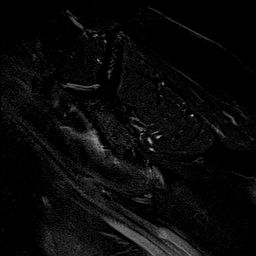
[im 9/23]
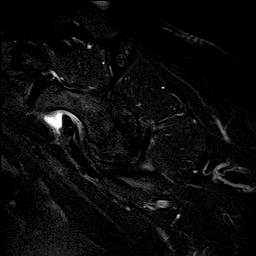
[im 12/23]
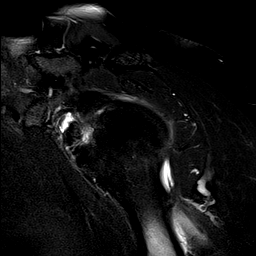
[im 14/23]
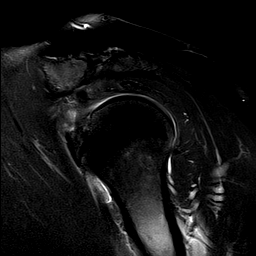
[im 17/23]
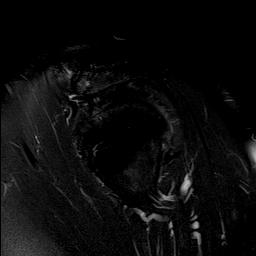
[im 20/23]
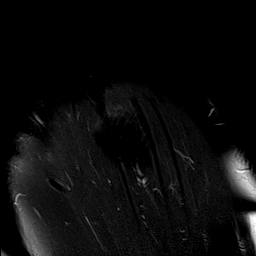
[im 23/23]
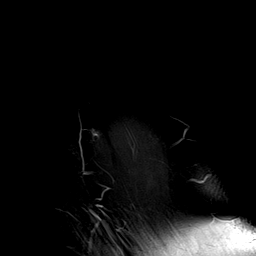

[Series 5: T1 · oblique · 4.0mm · 0.22mm/px · 2 of 23 slices shown]
[im 1/23]
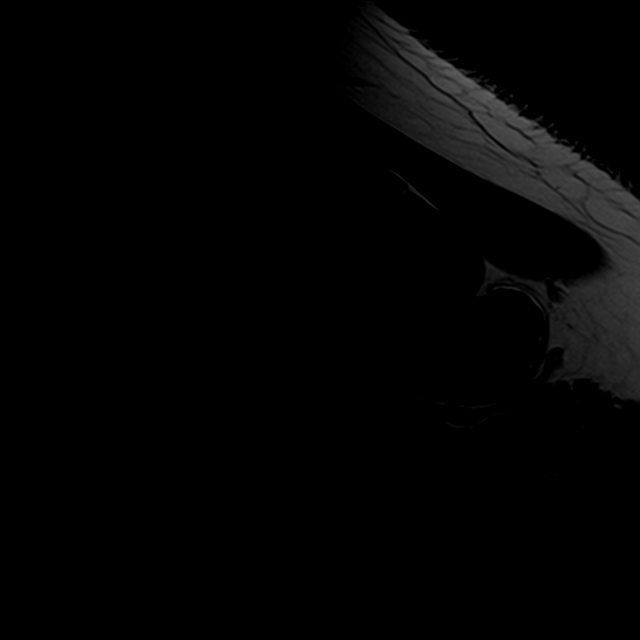
[im 3/23]
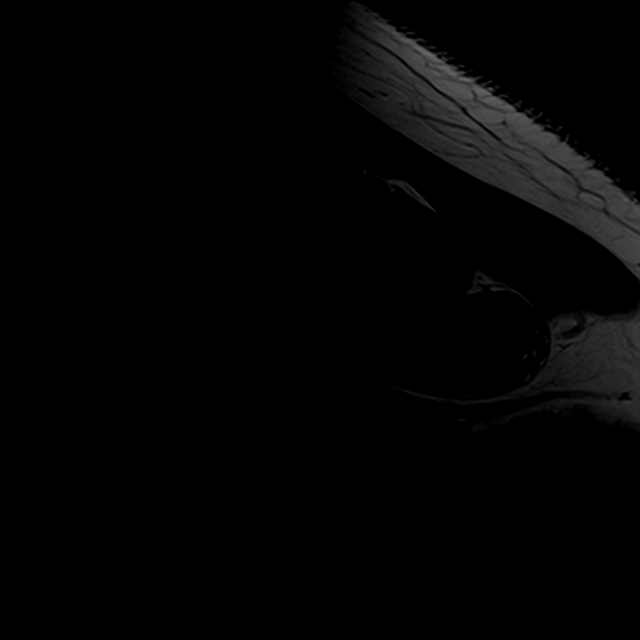

[Series 6: T2 fat-sat · oblique · 4.0mm · 0.27mm/px · 7 of 17 slices shown (3 of 3)]
[im 1/17]
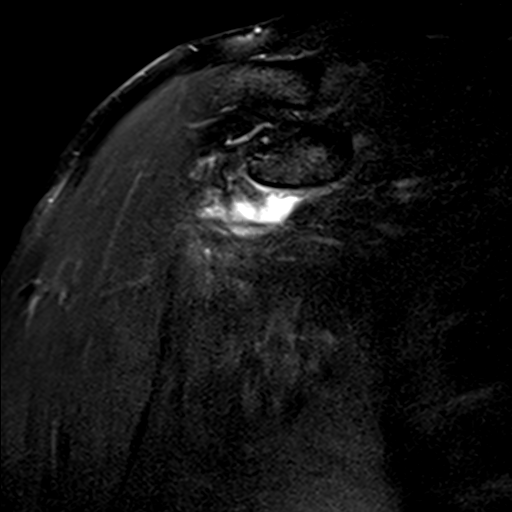
[im 3/17]
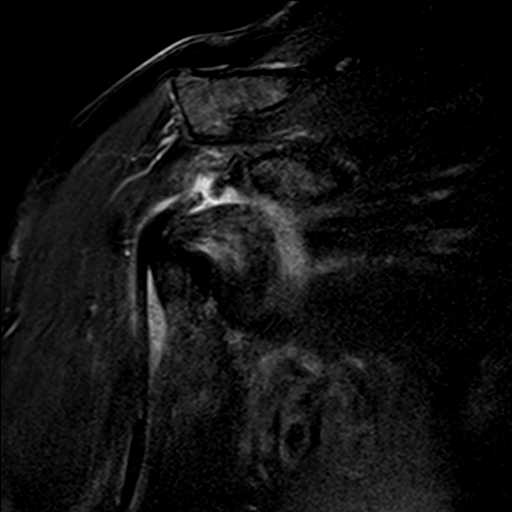
[im 6/17]
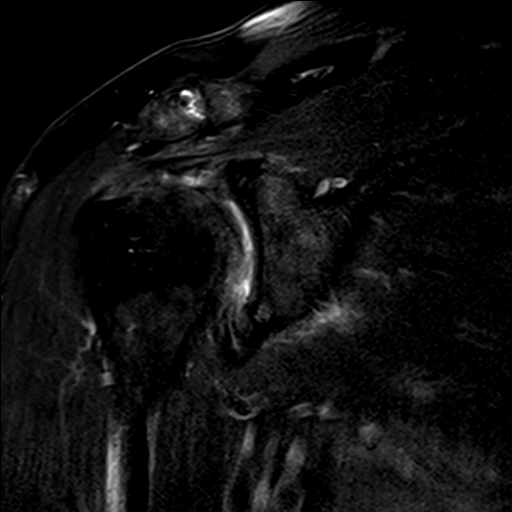
[im 9/17]
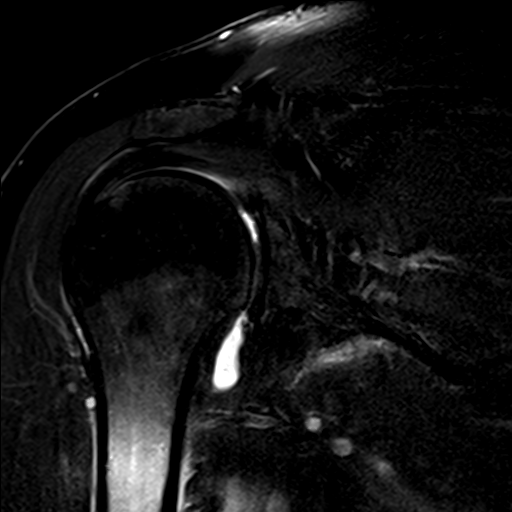
[im 11/17]
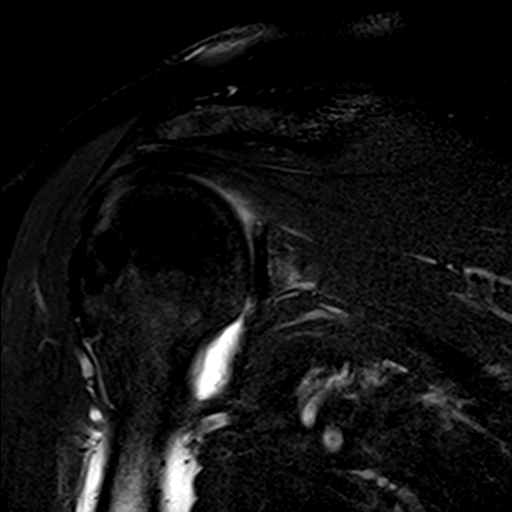
[im 14/17]
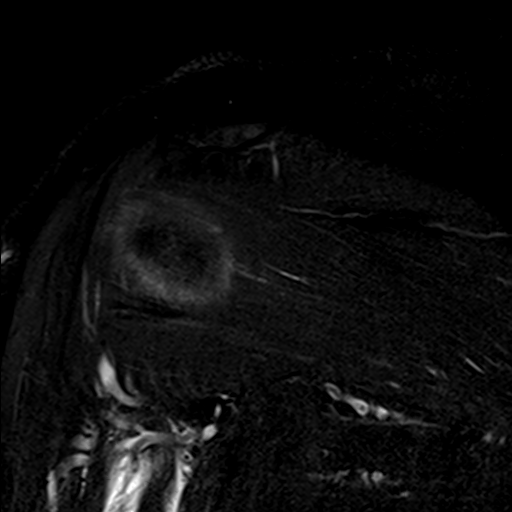
[im 17/17]
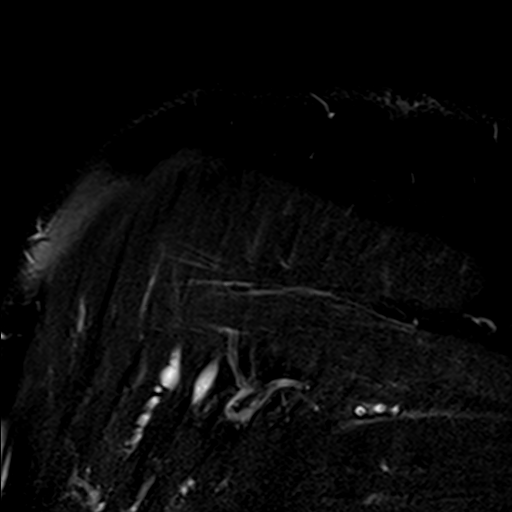

[Series 7: PD · oblique · 4.0mm · 0.44mm/px · 7 of 17 slices shown]
[im 1/17]
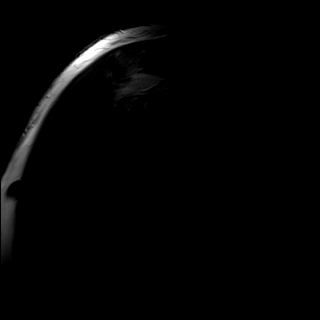
[im 3/17]
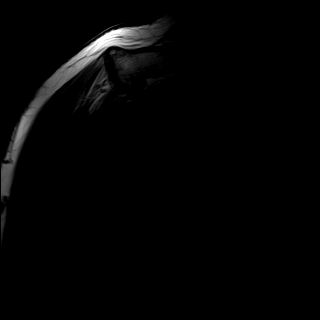
[im 6/17]
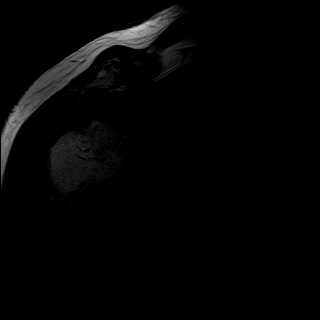
[im 9/17]
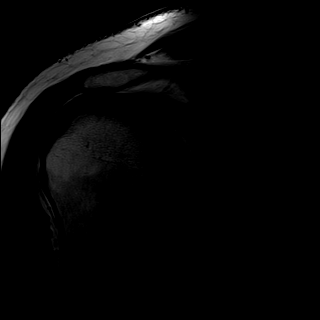
[im 11/17]
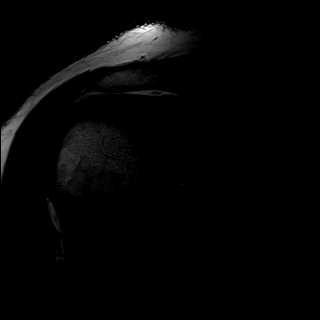
[im 14/17]
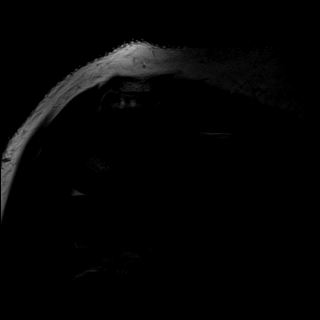
[im 17/17]
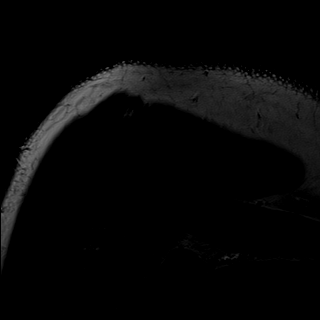

[33 of 40 positions shown; findings below may reference images not displayed]

FINDINGS: Rotator cuff: Moderate rotator cuff tendinopathy/ tendinosis with
interstitial tears but no partial or full thickness rotator cuff
tear.

Muscles:  Normal

Biceps long head:  Intact

Acromioclavicular Joint: Advanced AC joint degenerative changes.
Type II acromion. No significant lateral downsloping or undersurface
spurring.

Glenohumeral Joint: Moderate degenerative changes with joint space
narrowing, early spurring and degenerative chondrosis. Small joint
effusion and mild synovitis.

Labrum: Labral degenerative changes. Suspect an anterior labral
tear.

Bones:  No acute bony findings.

Other: No subacromial/subdeltoid fluid collections to suggest
bursitis.
IMPRESSION: 1. Moderate rotator cuff tendinopathy/tendinosis with interstitial
tears but no partial or full-thickness tear.
2. Glenohumeral joint degenerative changes.
3. Labral degenerative changes with suspected anterior labral tear.
4. Advanced AC joint degenerative changes but no other significant
findings for bony impingement.

## 2019-05-24 DIAGNOSIS — E1165 Type 2 diabetes mellitus with hyperglycemia: Secondary | ICD-10-CM | POA: Diagnosis not present

## 2019-05-24 DIAGNOSIS — E113599 Type 2 diabetes mellitus with proliferative diabetic retinopathy without macular edema, unspecified eye: Secondary | ICD-10-CM | POA: Diagnosis not present

## 2019-05-24 DIAGNOSIS — Z7984 Long term (current) use of oral hypoglycemic drugs: Secondary | ICD-10-CM | POA: Diagnosis not present

## 2019-05-28 DIAGNOSIS — E1169 Type 2 diabetes mellitus with other specified complication: Secondary | ICD-10-CM | POA: Diagnosis not present

## 2019-05-28 DIAGNOSIS — Z7984 Long term (current) use of oral hypoglycemic drugs: Secondary | ICD-10-CM | POA: Diagnosis not present

## 2019-06-02 DIAGNOSIS — Z23 Encounter for immunization: Secondary | ICD-10-CM | POA: Diagnosis not present

## 2019-09-15 DIAGNOSIS — Z6837 Body mass index (BMI) 37.0-37.9, adult: Secondary | ICD-10-CM | POA: Diagnosis not present

## 2019-09-15 DIAGNOSIS — E7800 Pure hypercholesterolemia, unspecified: Secondary | ICD-10-CM | POA: Diagnosis not present

## 2019-09-15 DIAGNOSIS — Z0000 Encounter for general adult medical examination without abnormal findings: Secondary | ICD-10-CM | POA: Diagnosis not present

## 2019-09-15 DIAGNOSIS — J4540 Moderate persistent asthma, uncomplicated: Secondary | ICD-10-CM | POA: Diagnosis not present

## 2019-09-15 DIAGNOSIS — Z1389 Encounter for screening for other disorder: Secondary | ICD-10-CM | POA: Diagnosis not present

## 2019-09-15 DIAGNOSIS — Z23 Encounter for immunization: Secondary | ICD-10-CM | POA: Diagnosis not present

## 2019-09-15 DIAGNOSIS — E1169 Type 2 diabetes mellitus with other specified complication: Secondary | ICD-10-CM | POA: Diagnosis not present

## 2019-09-15 DIAGNOSIS — I10 Essential (primary) hypertension: Secondary | ICD-10-CM | POA: Diagnosis not present

## 2019-09-15 DIAGNOSIS — Z86010 Personal history of colonic polyps: Secondary | ICD-10-CM | POA: Diagnosis not present

## 2019-09-15 DIAGNOSIS — K219 Gastro-esophageal reflux disease without esophagitis: Secondary | ICD-10-CM | POA: Diagnosis not present

## 2019-10-18 DIAGNOSIS — H5213 Myopia, bilateral: Secondary | ICD-10-CM | POA: Diagnosis not present

## 2019-10-18 DIAGNOSIS — H2513 Age-related nuclear cataract, bilateral: Secondary | ICD-10-CM | POA: Diagnosis not present

## 2019-10-18 DIAGNOSIS — H524 Presbyopia: Secondary | ICD-10-CM | POA: Diagnosis not present

## 2019-10-18 DIAGNOSIS — E119 Type 2 diabetes mellitus without complications: Secondary | ICD-10-CM | POA: Diagnosis not present

## 2019-10-18 DIAGNOSIS — Z794 Long term (current) use of insulin: Secondary | ICD-10-CM | POA: Diagnosis not present

## 2019-10-18 DIAGNOSIS — Z460 Encounter for fitting and adjustment of spectacles and contact lenses: Secondary | ICD-10-CM | POA: Diagnosis not present

## 2019-11-12 DIAGNOSIS — F325 Major depressive disorder, single episode, in full remission: Secondary | ICD-10-CM | POA: Diagnosis not present

## 2019-11-12 DIAGNOSIS — M179 Osteoarthritis of knee, unspecified: Secondary | ICD-10-CM | POA: Diagnosis not present

## 2019-11-12 DIAGNOSIS — E1169 Type 2 diabetes mellitus with other specified complication: Secondary | ICD-10-CM | POA: Diagnosis not present

## 2019-11-12 DIAGNOSIS — I10 Essential (primary) hypertension: Secondary | ICD-10-CM | POA: Diagnosis not present

## 2019-11-12 DIAGNOSIS — J4540 Moderate persistent asthma, uncomplicated: Secondary | ICD-10-CM | POA: Diagnosis not present

## 2019-11-12 DIAGNOSIS — E7800 Pure hypercholesterolemia, unspecified: Secondary | ICD-10-CM | POA: Diagnosis not present

## 2019-11-18 DIAGNOSIS — Z1159 Encounter for screening for other viral diseases: Secondary | ICD-10-CM | POA: Diagnosis not present

## 2019-11-23 DIAGNOSIS — K5730 Diverticulosis of large intestine without perforation or abscess without bleeding: Secondary | ICD-10-CM | POA: Diagnosis not present

## 2019-11-23 DIAGNOSIS — D120 Benign neoplasm of cecum: Secondary | ICD-10-CM | POA: Diagnosis not present

## 2019-11-23 DIAGNOSIS — K621 Rectal polyp: Secondary | ICD-10-CM | POA: Diagnosis not present

## 2019-11-23 DIAGNOSIS — Z86010 Personal history of colonic polyps: Secondary | ICD-10-CM | POA: Diagnosis not present

## 2019-11-23 DIAGNOSIS — D123 Benign neoplasm of transverse colon: Secondary | ICD-10-CM | POA: Diagnosis not present

## 2019-11-26 DIAGNOSIS — D123 Benign neoplasm of transverse colon: Secondary | ICD-10-CM | POA: Diagnosis not present

## 2019-11-26 DIAGNOSIS — K621 Rectal polyp: Secondary | ICD-10-CM | POA: Diagnosis not present

## 2019-11-26 DIAGNOSIS — D120 Benign neoplasm of cecum: Secondary | ICD-10-CM | POA: Diagnosis not present

## 2019-12-17 DIAGNOSIS — J4540 Moderate persistent asthma, uncomplicated: Secondary | ICD-10-CM | POA: Diagnosis not present

## 2019-12-17 DIAGNOSIS — E1169 Type 2 diabetes mellitus with other specified complication: Secondary | ICD-10-CM | POA: Diagnosis not present

## 2019-12-17 DIAGNOSIS — I10 Essential (primary) hypertension: Secondary | ICD-10-CM | POA: Diagnosis not present

## 2019-12-17 DIAGNOSIS — E7800 Pure hypercholesterolemia, unspecified: Secondary | ICD-10-CM | POA: Diagnosis not present

## 2019-12-17 DIAGNOSIS — M179 Osteoarthritis of knee, unspecified: Secondary | ICD-10-CM | POA: Diagnosis not present

## 2019-12-17 DIAGNOSIS — F325 Major depressive disorder, single episode, in full remission: Secondary | ICD-10-CM | POA: Diagnosis not present

## 2020-01-14 DIAGNOSIS — I10 Essential (primary) hypertension: Secondary | ICD-10-CM | POA: Diagnosis not present

## 2020-01-14 DIAGNOSIS — Z23 Encounter for immunization: Secondary | ICD-10-CM | POA: Diagnosis not present

## 2020-01-14 DIAGNOSIS — E1169 Type 2 diabetes mellitus with other specified complication: Secondary | ICD-10-CM | POA: Diagnosis not present

## 2020-01-14 DIAGNOSIS — Z794 Long term (current) use of insulin: Secondary | ICD-10-CM | POA: Diagnosis not present

## 2020-01-14 DIAGNOSIS — K219 Gastro-esophageal reflux disease without esophagitis: Secondary | ICD-10-CM | POA: Diagnosis not present

## 2020-02-16 DIAGNOSIS — E7800 Pure hypercholesterolemia, unspecified: Secondary | ICD-10-CM | POA: Diagnosis not present

## 2020-02-16 DIAGNOSIS — F325 Major depressive disorder, single episode, in full remission: Secondary | ICD-10-CM | POA: Diagnosis not present

## 2020-02-16 DIAGNOSIS — J4540 Moderate persistent asthma, uncomplicated: Secondary | ICD-10-CM | POA: Diagnosis not present

## 2020-02-16 DIAGNOSIS — M179 Osteoarthritis of knee, unspecified: Secondary | ICD-10-CM | POA: Diagnosis not present

## 2020-02-16 DIAGNOSIS — E1169 Type 2 diabetes mellitus with other specified complication: Secondary | ICD-10-CM | POA: Diagnosis not present

## 2020-02-16 DIAGNOSIS — I10 Essential (primary) hypertension: Secondary | ICD-10-CM | POA: Diagnosis not present

## 2020-02-25 DIAGNOSIS — M179 Osteoarthritis of knee, unspecified: Secondary | ICD-10-CM | POA: Diagnosis not present

## 2020-02-25 DIAGNOSIS — J4540 Moderate persistent asthma, uncomplicated: Secondary | ICD-10-CM | POA: Diagnosis not present

## 2020-02-25 DIAGNOSIS — E7800 Pure hypercholesterolemia, unspecified: Secondary | ICD-10-CM | POA: Diagnosis not present

## 2020-02-25 DIAGNOSIS — E1169 Type 2 diabetes mellitus with other specified complication: Secondary | ICD-10-CM | POA: Diagnosis not present

## 2020-02-25 DIAGNOSIS — F325 Major depressive disorder, single episode, in full remission: Secondary | ICD-10-CM | POA: Diagnosis not present

## 2020-02-25 DIAGNOSIS — I10 Essential (primary) hypertension: Secondary | ICD-10-CM | POA: Diagnosis not present

## 2020-03-13 DIAGNOSIS — D1801 Hemangioma of skin and subcutaneous tissue: Secondary | ICD-10-CM | POA: Diagnosis not present

## 2020-03-13 DIAGNOSIS — L821 Other seborrheic keratosis: Secondary | ICD-10-CM | POA: Diagnosis not present

## 2020-03-13 DIAGNOSIS — D225 Melanocytic nevi of trunk: Secondary | ICD-10-CM | POA: Diagnosis not present

## 2020-03-13 DIAGNOSIS — D2271 Melanocytic nevi of right lower limb, including hip: Secondary | ICD-10-CM | POA: Diagnosis not present

## 2020-03-13 DIAGNOSIS — L570 Actinic keratosis: Secondary | ICD-10-CM | POA: Diagnosis not present

## 2020-03-13 DIAGNOSIS — Z85828 Personal history of other malignant neoplasm of skin: Secondary | ICD-10-CM | POA: Diagnosis not present

## 2020-03-13 DIAGNOSIS — L814 Other melanin hyperpigmentation: Secondary | ICD-10-CM | POA: Diagnosis not present

## 2020-05-03 DIAGNOSIS — E1169 Type 2 diabetes mellitus with other specified complication: Secondary | ICD-10-CM | POA: Diagnosis not present

## 2020-05-03 DIAGNOSIS — K219 Gastro-esophageal reflux disease without esophagitis: Secondary | ICD-10-CM | POA: Diagnosis not present

## 2020-05-03 DIAGNOSIS — F325 Major depressive disorder, single episode, in full remission: Secondary | ICD-10-CM | POA: Diagnosis not present

## 2020-05-03 DIAGNOSIS — E7800 Pure hypercholesterolemia, unspecified: Secondary | ICD-10-CM | POA: Diagnosis not present

## 2020-05-03 DIAGNOSIS — I10 Essential (primary) hypertension: Secondary | ICD-10-CM | POA: Diagnosis not present

## 2020-05-03 DIAGNOSIS — J4540 Moderate persistent asthma, uncomplicated: Secondary | ICD-10-CM | POA: Diagnosis not present

## 2020-05-03 DIAGNOSIS — M179 Osteoarthritis of knee, unspecified: Secondary | ICD-10-CM | POA: Diagnosis not present

## 2020-05-16 DIAGNOSIS — E291 Testicular hypofunction: Secondary | ICD-10-CM | POA: Diagnosis not present

## 2020-05-16 DIAGNOSIS — I10 Essential (primary) hypertension: Secondary | ICD-10-CM | POA: Diagnosis not present

## 2020-05-16 DIAGNOSIS — K219 Gastro-esophageal reflux disease without esophagitis: Secondary | ICD-10-CM | POA: Diagnosis not present

## 2020-05-16 DIAGNOSIS — Z23 Encounter for immunization: Secondary | ICD-10-CM | POA: Diagnosis not present

## 2020-05-16 DIAGNOSIS — E1169 Type 2 diabetes mellitus with other specified complication: Secondary | ICD-10-CM | POA: Diagnosis not present

## 2020-05-16 DIAGNOSIS — R5383 Other fatigue: Secondary | ICD-10-CM | POA: Diagnosis not present

## 2020-05-24 DIAGNOSIS — E291 Testicular hypofunction: Secondary | ICD-10-CM | POA: Diagnosis not present

## 2020-05-31 DIAGNOSIS — R972 Elevated prostate specific antigen [PSA]: Secondary | ICD-10-CM | POA: Diagnosis not present

## 2020-05-31 DIAGNOSIS — E291 Testicular hypofunction: Secondary | ICD-10-CM | POA: Diagnosis not present

## 2020-05-31 DIAGNOSIS — Z125 Encounter for screening for malignant neoplasm of prostate: Secondary | ICD-10-CM | POA: Diagnosis not present

## 2020-07-18 DIAGNOSIS — F325 Major depressive disorder, single episode, in full remission: Secondary | ICD-10-CM | POA: Diagnosis not present

## 2020-07-18 DIAGNOSIS — E1169 Type 2 diabetes mellitus with other specified complication: Secondary | ICD-10-CM | POA: Diagnosis not present

## 2020-07-18 DIAGNOSIS — I10 Essential (primary) hypertension: Secondary | ICD-10-CM | POA: Diagnosis not present

## 2020-07-18 DIAGNOSIS — M179 Osteoarthritis of knee, unspecified: Secondary | ICD-10-CM | POA: Diagnosis not present

## 2020-07-18 DIAGNOSIS — E7800 Pure hypercholesterolemia, unspecified: Secondary | ICD-10-CM | POA: Diagnosis not present

## 2020-07-18 DIAGNOSIS — J4540 Moderate persistent asthma, uncomplicated: Secondary | ICD-10-CM | POA: Diagnosis not present

## 2020-07-18 DIAGNOSIS — K219 Gastro-esophageal reflux disease without esophagitis: Secondary | ICD-10-CM | POA: Diagnosis not present

## 2020-09-20 DIAGNOSIS — R972 Elevated prostate specific antigen [PSA]: Secondary | ICD-10-CM | POA: Diagnosis not present

## 2020-09-20 DIAGNOSIS — E1142 Type 2 diabetes mellitus with diabetic polyneuropathy: Secondary | ICD-10-CM | POA: Diagnosis not present

## 2020-09-20 DIAGNOSIS — E291 Testicular hypofunction: Secondary | ICD-10-CM | POA: Diagnosis not present

## 2020-09-20 DIAGNOSIS — D72829 Elevated white blood cell count, unspecified: Secondary | ICD-10-CM | POA: Diagnosis not present

## 2020-09-20 DIAGNOSIS — K219 Gastro-esophageal reflux disease without esophagitis: Secondary | ICD-10-CM | POA: Diagnosis not present

## 2020-09-20 DIAGNOSIS — Z1389 Encounter for screening for other disorder: Secondary | ICD-10-CM | POA: Diagnosis not present

## 2020-09-20 DIAGNOSIS — J4540 Moderate persistent asthma, uncomplicated: Secondary | ICD-10-CM | POA: Diagnosis not present

## 2020-09-20 DIAGNOSIS — Z794 Long term (current) use of insulin: Secondary | ICD-10-CM | POA: Diagnosis not present

## 2020-09-20 DIAGNOSIS — Z0000 Encounter for general adult medical examination without abnormal findings: Secondary | ICD-10-CM | POA: Diagnosis not present

## 2020-09-20 DIAGNOSIS — E7800 Pure hypercholesterolemia, unspecified: Secondary | ICD-10-CM | POA: Diagnosis not present

## 2020-09-20 DIAGNOSIS — I10 Essential (primary) hypertension: Secondary | ICD-10-CM | POA: Diagnosis not present

## 2020-11-09 DIAGNOSIS — E1169 Type 2 diabetes mellitus with other specified complication: Secondary | ICD-10-CM | POA: Diagnosis not present

## 2020-11-09 DIAGNOSIS — M179 Osteoarthritis of knee, unspecified: Secondary | ICD-10-CM | POA: Diagnosis not present

## 2020-11-09 DIAGNOSIS — J4540 Moderate persistent asthma, uncomplicated: Secondary | ICD-10-CM | POA: Diagnosis not present

## 2020-11-09 DIAGNOSIS — F325 Major depressive disorder, single episode, in full remission: Secondary | ICD-10-CM | POA: Diagnosis not present

## 2020-11-09 DIAGNOSIS — E1142 Type 2 diabetes mellitus with diabetic polyneuropathy: Secondary | ICD-10-CM | POA: Diagnosis not present

## 2020-11-09 DIAGNOSIS — I10 Essential (primary) hypertension: Secondary | ICD-10-CM | POA: Diagnosis not present

## 2020-11-09 DIAGNOSIS — E7800 Pure hypercholesterolemia, unspecified: Secondary | ICD-10-CM | POA: Diagnosis not present

## 2020-11-09 DIAGNOSIS — K219 Gastro-esophageal reflux disease without esophagitis: Secondary | ICD-10-CM | POA: Diagnosis not present

## 2021-01-22 DIAGNOSIS — D72829 Elevated white blood cell count, unspecified: Secondary | ICD-10-CM | POA: Diagnosis not present

## 2021-01-22 DIAGNOSIS — R972 Elevated prostate specific antigen [PSA]: Secondary | ICD-10-CM | POA: Diagnosis not present

## 2021-01-22 DIAGNOSIS — I10 Essential (primary) hypertension: Secondary | ICD-10-CM | POA: Diagnosis not present

## 2021-01-22 DIAGNOSIS — R058 Other specified cough: Secondary | ICD-10-CM | POA: Diagnosis not present

## 2021-01-22 DIAGNOSIS — E1169 Type 2 diabetes mellitus with other specified complication: Secondary | ICD-10-CM | POA: Diagnosis not present

## 2021-01-22 DIAGNOSIS — Z794 Long term (current) use of insulin: Secondary | ICD-10-CM | POA: Diagnosis not present

## 2021-01-22 DIAGNOSIS — K219 Gastro-esophageal reflux disease without esophagitis: Secondary | ICD-10-CM | POA: Diagnosis not present

## 2021-03-07 DIAGNOSIS — D72829 Elevated white blood cell count, unspecified: Secondary | ICD-10-CM | POA: Diagnosis not present

## 2021-03-15 DIAGNOSIS — D2372 Other benign neoplasm of skin of left lower limb, including hip: Secondary | ICD-10-CM | POA: Diagnosis not present

## 2021-03-15 DIAGNOSIS — D1801 Hemangioma of skin and subcutaneous tissue: Secondary | ICD-10-CM | POA: Diagnosis not present

## 2021-03-15 DIAGNOSIS — Z85828 Personal history of other malignant neoplasm of skin: Secondary | ICD-10-CM | POA: Diagnosis not present

## 2021-03-15 DIAGNOSIS — L814 Other melanin hyperpigmentation: Secondary | ICD-10-CM | POA: Diagnosis not present

## 2021-03-15 DIAGNOSIS — L570 Actinic keratosis: Secondary | ICD-10-CM | POA: Diagnosis not present

## 2021-03-15 DIAGNOSIS — L821 Other seborrheic keratosis: Secondary | ICD-10-CM | POA: Diagnosis not present

## 2021-03-23 DIAGNOSIS — R972 Elevated prostate specific antigen [PSA]: Secondary | ICD-10-CM | POA: Diagnosis not present

## 2021-04-09 ENCOUNTER — Ambulatory Visit
Admission: RE | Admit: 2021-04-09 | Discharge: 2021-04-09 | Disposition: A | Discharge status: Home / Self Care | Payer: Medicare Other | Source: Ambulatory Visit | Attending: Internal Medicine | Admitting: Internal Medicine

## 2021-04-09 ENCOUNTER — Other Ambulatory Visit: Payer: Self-pay | Admitting: Internal Medicine

## 2021-04-09 DIAGNOSIS — I700 Atherosclerosis of aorta: Secondary | ICD-10-CM | POA: Diagnosis not present

## 2021-04-09 DIAGNOSIS — N281 Cyst of kidney, acquired: Secondary | ICD-10-CM | POA: Diagnosis not present

## 2021-04-09 DIAGNOSIS — R1011 Right upper quadrant pain: Secondary | ICD-10-CM

## 2021-04-09 DIAGNOSIS — J9811 Atelectasis: Secondary | ICD-10-CM | POA: Diagnosis not present

## 2021-04-09 DIAGNOSIS — K6389 Other specified diseases of intestine: Secondary | ICD-10-CM | POA: Diagnosis not present

## 2021-04-09 DIAGNOSIS — N3289 Other specified disorders of bladder: Secondary | ICD-10-CM | POA: Diagnosis not present

## 2021-04-09 MED ORDER — IOPAMIDOL (ISOVUE-300) INJECTION 61%
100.0000 mL | Freq: Once | INTRAVENOUS | Status: AC | PRN
  Administered 2021-04-09: 125 mL via INTRAVENOUS

## 2021-04-13 DIAGNOSIS — M179 Osteoarthritis of knee, unspecified: Secondary | ICD-10-CM | POA: Diagnosis not present

## 2021-04-13 DIAGNOSIS — E7800 Pure hypercholesterolemia, unspecified: Secondary | ICD-10-CM | POA: Diagnosis not present

## 2021-04-13 DIAGNOSIS — E1142 Type 2 diabetes mellitus with diabetic polyneuropathy: Secondary | ICD-10-CM | POA: Diagnosis not present

## 2021-04-13 DIAGNOSIS — J4540 Moderate persistent asthma, uncomplicated: Secondary | ICD-10-CM | POA: Diagnosis not present

## 2021-04-13 DIAGNOSIS — K219 Gastro-esophageal reflux disease without esophagitis: Secondary | ICD-10-CM | POA: Diagnosis not present

## 2021-04-13 DIAGNOSIS — I10 Essential (primary) hypertension: Secondary | ICD-10-CM | POA: Diagnosis not present

## 2021-04-13 DIAGNOSIS — E1169 Type 2 diabetes mellitus with other specified complication: Secondary | ICD-10-CM | POA: Diagnosis not present

## 2021-04-13 DIAGNOSIS — F325 Major depressive disorder, single episode, in full remission: Secondary | ICD-10-CM | POA: Diagnosis not present

## 2021-04-17 DIAGNOSIS — K6389 Other specified diseases of intestine: Secondary | ICD-10-CM | POA: Diagnosis not present

## 2021-04-25 DIAGNOSIS — Z794 Long term (current) use of insulin: Secondary | ICD-10-CM | POA: Diagnosis not present

## 2021-04-25 DIAGNOSIS — J4540 Moderate persistent asthma, uncomplicated: Secondary | ICD-10-CM | POA: Diagnosis not present

## 2021-04-25 DIAGNOSIS — I10 Essential (primary) hypertension: Secondary | ICD-10-CM | POA: Diagnosis not present

## 2021-04-25 DIAGNOSIS — E1142 Type 2 diabetes mellitus with diabetic polyneuropathy: Secondary | ICD-10-CM | POA: Diagnosis not present

## 2021-04-25 DIAGNOSIS — Z6835 Body mass index (BMI) 35.0-35.9, adult: Secondary | ICD-10-CM | POA: Diagnosis not present

## 2021-04-25 DIAGNOSIS — D72829 Elevated white blood cell count, unspecified: Secondary | ICD-10-CM | POA: Diagnosis not present

## 2021-08-28 DIAGNOSIS — J4540 Moderate persistent asthma, uncomplicated: Secondary | ICD-10-CM | POA: Diagnosis not present

## 2021-08-28 DIAGNOSIS — E1169 Type 2 diabetes mellitus with other specified complication: Secondary | ICD-10-CM | POA: Diagnosis not present

## 2021-08-28 DIAGNOSIS — Z794 Long term (current) use of insulin: Secondary | ICD-10-CM | POA: Diagnosis not present

## 2021-08-28 DIAGNOSIS — E7800 Pure hypercholesterolemia, unspecified: Secondary | ICD-10-CM | POA: Diagnosis not present

## 2021-08-28 DIAGNOSIS — Z23 Encounter for immunization: Secondary | ICD-10-CM | POA: Diagnosis not present

## 2021-08-28 DIAGNOSIS — I10 Essential (primary) hypertension: Secondary | ICD-10-CM | POA: Diagnosis not present

## 2021-08-28 DIAGNOSIS — K219 Gastro-esophageal reflux disease without esophagitis: Secondary | ICD-10-CM | POA: Diagnosis not present

## 2021-10-18 DIAGNOSIS — R972 Elevated prostate specific antigen [PSA]: Secondary | ICD-10-CM | POA: Diagnosis not present

## 2021-10-19 DIAGNOSIS — E7800 Pure hypercholesterolemia, unspecified: Secondary | ICD-10-CM | POA: Diagnosis not present

## 2021-10-25 DIAGNOSIS — R972 Elevated prostate specific antigen [PSA]: Secondary | ICD-10-CM | POA: Diagnosis not present

## 2021-10-31 DIAGNOSIS — E291 Testicular hypofunction: Secondary | ICD-10-CM | POA: Diagnosis not present

## 2021-11-02 DIAGNOSIS — E291 Testicular hypofunction: Secondary | ICD-10-CM | POA: Diagnosis not present

## 2021-11-09 DIAGNOSIS — E291 Testicular hypofunction: Secondary | ICD-10-CM | POA: Diagnosis not present

## 2021-11-14 DIAGNOSIS — I10 Essential (primary) hypertension: Secondary | ICD-10-CM | POA: Diagnosis not present

## 2021-11-14 DIAGNOSIS — E1142 Type 2 diabetes mellitus with diabetic polyneuropathy: Secondary | ICD-10-CM | POA: Diagnosis not present

## 2021-11-14 DIAGNOSIS — E7800 Pure hypercholesterolemia, unspecified: Secondary | ICD-10-CM | POA: Diagnosis not present

## 2021-11-16 DIAGNOSIS — E291 Testicular hypofunction: Secondary | ICD-10-CM | POA: Diagnosis not present

## 2021-11-30 DIAGNOSIS — E291 Testicular hypofunction: Secondary | ICD-10-CM | POA: Diagnosis not present

## 2021-12-14 DIAGNOSIS — E291 Testicular hypofunction: Secondary | ICD-10-CM | POA: Diagnosis not present

## 2021-12-18 DIAGNOSIS — E7800 Pure hypercholesterolemia, unspecified: Secondary | ICD-10-CM | POA: Diagnosis not present

## 2021-12-18 DIAGNOSIS — Z1389 Encounter for screening for other disorder: Secondary | ICD-10-CM | POA: Diagnosis not present

## 2021-12-18 DIAGNOSIS — K219 Gastro-esophageal reflux disease without esophagitis: Secondary | ICD-10-CM | POA: Diagnosis not present

## 2021-12-18 DIAGNOSIS — Z6835 Body mass index (BMI) 35.0-35.9, adult: Secondary | ICD-10-CM | POA: Diagnosis not present

## 2021-12-18 DIAGNOSIS — Z79899 Other long term (current) drug therapy: Secondary | ICD-10-CM | POA: Diagnosis not present

## 2021-12-18 DIAGNOSIS — Z5181 Encounter for therapeutic drug level monitoring: Secondary | ICD-10-CM | POA: Diagnosis not present

## 2021-12-18 DIAGNOSIS — E1142 Type 2 diabetes mellitus with diabetic polyneuropathy: Secondary | ICD-10-CM | POA: Diagnosis not present

## 2021-12-18 DIAGNOSIS — Z0000 Encounter for general adult medical examination without abnormal findings: Secondary | ICD-10-CM | POA: Diagnosis not present

## 2021-12-18 DIAGNOSIS — I10 Essential (primary) hypertension: Secondary | ICD-10-CM | POA: Diagnosis not present

## 2021-12-18 DIAGNOSIS — J4540 Moderate persistent asthma, uncomplicated: Secondary | ICD-10-CM | POA: Diagnosis not present

## 2021-12-28 DIAGNOSIS — E291 Testicular hypofunction: Secondary | ICD-10-CM | POA: Diagnosis not present

## 2022-01-01 ENCOUNTER — Telehealth: Payer: Self-pay | Admitting: Oncology

## 2022-01-01 NOTE — Telephone Encounter (Signed)
Called 1X, no answer but voicemail was left for patient to call back. Scheduling for initial visit from referral  ?

## 2022-01-11 DIAGNOSIS — E291 Testicular hypofunction: Secondary | ICD-10-CM | POA: Diagnosis not present

## 2022-01-25 DIAGNOSIS — E291 Testicular hypofunction: Secondary | ICD-10-CM | POA: Diagnosis not present

## 2022-02-08 DIAGNOSIS — E291 Testicular hypofunction: Secondary | ICD-10-CM | POA: Diagnosis not present

## 2022-03-12 DIAGNOSIS — H2513 Age-related nuclear cataract, bilateral: Secondary | ICD-10-CM | POA: Diagnosis not present

## 2022-03-12 DIAGNOSIS — E119 Type 2 diabetes mellitus without complications: Secondary | ICD-10-CM | POA: Diagnosis not present

## 2022-03-12 DIAGNOSIS — Z794 Long term (current) use of insulin: Secondary | ICD-10-CM | POA: Diagnosis not present

## 2022-03-22 DIAGNOSIS — D2372 Other benign neoplasm of skin of left lower limb, including hip: Secondary | ICD-10-CM | POA: Diagnosis not present

## 2022-03-22 DIAGNOSIS — L814 Other melanin hyperpigmentation: Secondary | ICD-10-CM | POA: Diagnosis not present

## 2022-03-22 DIAGNOSIS — L821 Other seborrheic keratosis: Secondary | ICD-10-CM | POA: Diagnosis not present

## 2022-03-22 DIAGNOSIS — D1801 Hemangioma of skin and subcutaneous tissue: Secondary | ICD-10-CM | POA: Diagnosis not present

## 2022-03-22 DIAGNOSIS — Z85828 Personal history of other malignant neoplasm of skin: Secondary | ICD-10-CM | POA: Diagnosis not present

## 2022-03-22 DIAGNOSIS — L570 Actinic keratosis: Secondary | ICD-10-CM | POA: Diagnosis not present

## 2022-03-22 DIAGNOSIS — D225 Melanocytic nevi of trunk: Secondary | ICD-10-CM | POA: Diagnosis not present

## 2022-04-18 DIAGNOSIS — R972 Elevated prostate specific antigen [PSA]: Secondary | ICD-10-CM | POA: Diagnosis not present

## 2022-04-25 DIAGNOSIS — R972 Elevated prostate specific antigen [PSA]: Secondary | ICD-10-CM | POA: Diagnosis not present

## 2022-04-29 ENCOUNTER — Other Ambulatory Visit: Payer: Self-pay | Admitting: Urology

## 2022-04-29 DIAGNOSIS — R972 Elevated prostate specific antigen [PSA]: Secondary | ICD-10-CM

## 2022-05-06 DIAGNOSIS — F325 Major depressive disorder, single episode, in full remission: Secondary | ICD-10-CM | POA: Diagnosis not present

## 2022-05-06 DIAGNOSIS — J4540 Moderate persistent asthma, uncomplicated: Secondary | ICD-10-CM | POA: Diagnosis not present

## 2022-05-06 DIAGNOSIS — K219 Gastro-esophageal reflux disease without esophagitis: Secondary | ICD-10-CM | POA: Diagnosis not present

## 2022-05-06 DIAGNOSIS — I10 Essential (primary) hypertension: Secondary | ICD-10-CM | POA: Diagnosis not present

## 2022-05-06 DIAGNOSIS — E7800 Pure hypercholesterolemia, unspecified: Secondary | ICD-10-CM | POA: Diagnosis not present

## 2022-05-06 DIAGNOSIS — E1169 Type 2 diabetes mellitus with other specified complication: Secondary | ICD-10-CM | POA: Diagnosis not present

## 2022-05-10 DIAGNOSIS — E1142 Type 2 diabetes mellitus with diabetic polyneuropathy: Secondary | ICD-10-CM | POA: Diagnosis not present

## 2022-05-10 DIAGNOSIS — Z794 Long term (current) use of insulin: Secondary | ICD-10-CM | POA: Diagnosis not present

## 2022-05-10 DIAGNOSIS — K219 Gastro-esophageal reflux disease without esophagitis: Secondary | ICD-10-CM | POA: Diagnosis not present

## 2022-05-10 DIAGNOSIS — R5383 Other fatigue: Secondary | ICD-10-CM | POA: Diagnosis not present

## 2022-05-10 DIAGNOSIS — D72829 Elevated white blood cell count, unspecified: Secondary | ICD-10-CM | POA: Diagnosis not present

## 2022-05-10 DIAGNOSIS — J4540 Moderate persistent asthma, uncomplicated: Secondary | ICD-10-CM | POA: Diagnosis not present

## 2022-05-10 DIAGNOSIS — Z23 Encounter for immunization: Secondary | ICD-10-CM | POA: Diagnosis not present

## 2022-05-10 DIAGNOSIS — I10 Essential (primary) hypertension: Secondary | ICD-10-CM | POA: Diagnosis not present

## 2022-05-16 ENCOUNTER — Ambulatory Visit
Admission: RE | Admit: 2022-05-16 | Discharge: 2022-05-16 | Disposition: A | Discharge status: Home / Self Care | Payer: Medicare Other | Source: Ambulatory Visit | Attending: Urology | Admitting: Urology

## 2022-05-16 DIAGNOSIS — K5730 Diverticulosis of large intestine without perforation or abscess without bleeding: Secondary | ICD-10-CM | POA: Diagnosis not present

## 2022-05-16 DIAGNOSIS — R972 Elevated prostate specific antigen [PSA]: Secondary | ICD-10-CM | POA: Diagnosis not present

## 2022-05-16 DIAGNOSIS — R590 Localized enlarged lymph nodes: Secondary | ICD-10-CM | POA: Diagnosis not present

## 2022-05-16 MED ORDER — GADOBENATE DIMEGLUMINE 529 MG/ML IV SOLN
20.0000 mL | Freq: Once | INTRAVENOUS | Status: AC | PRN
  Administered 2022-05-16: 20 mL via INTRAVENOUS

## 2022-07-25 DIAGNOSIS — R972 Elevated prostate specific antigen [PSA]: Secondary | ICD-10-CM | POA: Diagnosis not present

## 2022-08-01 DIAGNOSIS — R351 Nocturia: Secondary | ICD-10-CM | POA: Diagnosis not present

## 2022-08-01 DIAGNOSIS — N401 Enlarged prostate with lower urinary tract symptoms: Secondary | ICD-10-CM | POA: Diagnosis not present

## 2022-08-01 DIAGNOSIS — R972 Elevated prostate specific antigen [PSA]: Secondary | ICD-10-CM | POA: Diagnosis not present

## 2022-10-25 DIAGNOSIS — J4540 Moderate persistent asthma, uncomplicated: Secondary | ICD-10-CM | POA: Diagnosis not present

## 2022-10-25 DIAGNOSIS — N182 Chronic kidney disease, stage 2 (mild): Secondary | ICD-10-CM | POA: Diagnosis not present

## 2022-10-25 DIAGNOSIS — Z6835 Body mass index (BMI) 35.0-35.9, adult: Secondary | ICD-10-CM | POA: Diagnosis not present

## 2022-10-25 DIAGNOSIS — Z794 Long term (current) use of insulin: Secondary | ICD-10-CM | POA: Diagnosis not present

## 2022-10-25 DIAGNOSIS — B1920 Unspecified viral hepatitis C without hepatic coma: Secondary | ICD-10-CM | POA: Diagnosis not present

## 2022-10-25 DIAGNOSIS — I10 Essential (primary) hypertension: Secondary | ICD-10-CM | POA: Diagnosis not present

## 2022-10-25 DIAGNOSIS — F325 Major depressive disorder, single episode, in full remission: Secondary | ICD-10-CM | POA: Diagnosis not present

## 2022-10-25 DIAGNOSIS — E1142 Type 2 diabetes mellitus with diabetic polyneuropathy: Secondary | ICD-10-CM | POA: Diagnosis not present

## 2022-10-25 DIAGNOSIS — E7800 Pure hypercholesterolemia, unspecified: Secondary | ICD-10-CM | POA: Diagnosis not present

## 2022-12-23 DIAGNOSIS — D1803 Hemangioma of intra-abdominal structures: Secondary | ICD-10-CM | POA: Diagnosis not present

## 2022-12-23 DIAGNOSIS — N182 Chronic kidney disease, stage 2 (mild): Secondary | ICD-10-CM | POA: Diagnosis not present

## 2022-12-23 DIAGNOSIS — J4540 Moderate persistent asthma, uncomplicated: Secondary | ICD-10-CM | POA: Diagnosis not present

## 2022-12-23 DIAGNOSIS — Z1331 Encounter for screening for depression: Secondary | ICD-10-CM | POA: Diagnosis not present

## 2022-12-23 DIAGNOSIS — E1142 Type 2 diabetes mellitus with diabetic polyneuropathy: Secondary | ICD-10-CM | POA: Diagnosis not present

## 2022-12-23 DIAGNOSIS — Z0000 Encounter for general adult medical examination without abnormal findings: Secondary | ICD-10-CM | POA: Diagnosis not present

## 2022-12-23 DIAGNOSIS — E7800 Pure hypercholesterolemia, unspecified: Secondary | ICD-10-CM | POA: Diagnosis not present

## 2022-12-23 DIAGNOSIS — I700 Atherosclerosis of aorta: Secondary | ICD-10-CM | POA: Diagnosis not present

## 2022-12-23 DIAGNOSIS — B1920 Unspecified viral hepatitis C without hepatic coma: Secondary | ICD-10-CM | POA: Diagnosis not present

## 2022-12-23 DIAGNOSIS — E291 Testicular hypofunction: Secondary | ICD-10-CM | POA: Diagnosis not present

## 2022-12-23 DIAGNOSIS — Z794 Long term (current) use of insulin: Secondary | ICD-10-CM | POA: Diagnosis not present

## 2022-12-23 DIAGNOSIS — R972 Elevated prostate specific antigen [PSA]: Secondary | ICD-10-CM | POA: Diagnosis not present

## 2022-12-23 DIAGNOSIS — I10 Essential (primary) hypertension: Secondary | ICD-10-CM | POA: Diagnosis not present

## 2022-12-23 DIAGNOSIS — N281 Cyst of kidney, acquired: Secondary | ICD-10-CM | POA: Diagnosis not present

## 2023-01-23 DIAGNOSIS — R972 Elevated prostate specific antigen [PSA]: Secondary | ICD-10-CM | POA: Diagnosis not present

## 2023-01-30 DIAGNOSIS — R972 Elevated prostate specific antigen [PSA]: Secondary | ICD-10-CM | POA: Diagnosis not present

## 2023-01-30 DIAGNOSIS — N281 Cyst of kidney, acquired: Secondary | ICD-10-CM | POA: Diagnosis not present

## 2023-01-30 DIAGNOSIS — N401 Enlarged prostate with lower urinary tract symptoms: Secondary | ICD-10-CM | POA: Diagnosis not present

## 2023-01-30 DIAGNOSIS — R351 Nocturia: Secondary | ICD-10-CM | POA: Diagnosis not present

## 2023-02-06 ENCOUNTER — Encounter: Payer: Self-pay | Admitting: Cardiology

## 2023-02-06 DIAGNOSIS — E118 Type 2 diabetes mellitus with unspecified complications: Secondary | ICD-10-CM | POA: Insufficient documentation

## 2023-02-06 DIAGNOSIS — R0602 Shortness of breath: Secondary | ICD-10-CM | POA: Insufficient documentation

## 2023-02-06 DIAGNOSIS — I10 Essential (primary) hypertension: Secondary | ICD-10-CM | POA: Insufficient documentation

## 2023-02-06 NOTE — Progress Notes (Signed)
''    Cardiology Office Note   Date:  02/07/2023   ID:  Brendan Joseph, DOB Dec 01, 1945, MRN [id number]  PCP:  Georgann Housekeeper, MD  Cardiologist:   None Referring:  Georgann Housekeeper, MD  Chief Complaint  Patient presents with   Shortness of Breath   Fatigue      History of Present Illness: Brendan Joseph is a 77 y.o. male who presents for evaluation of SOB.  He has not had any prior cardiac workup.  He says his symptoms have become slowly progressive over few years.  The predominant complaint really is fatigue.  He said he used to golf more frequently and now he is down to less than once a week and sometimes cannot make it to the entire 18 holes.  He really just tires out if he tries to walk.  He has fatigue and weakness quickly, slowly progressive.  He does have some shortness of breath.  He does not have resting complaints and does not have PND or orthopnea.  He falls asleep on the couch.  He denies any chest pressure, neck or arm discomfort.  He is not having any palpitations, presyncope or syncope.   Past Medical History:  Diagnosis Date   Asthma, persistent    Albuterol as needed.Symbicort daily   Bilateral carpal tunnel syndrome    Chronic hepatitis C (HCC)    tx. with meds-now showing no enzyme elevation   Depression    Diabetes mellitus, type II (HCC)    controlled by diet and weight loss   Family hx of colon cancer    GERD (gastroesophageal reflux disease)    occasionally and will take OTC med if needs   History of colon polyps    benign   Hyperlipidemia    takes Crestor    Hypertension    takes Micardis and Amlodipine daily   Right knee DJD    DJD- right knee cortisone shot 1 month ago    Past Surgical History:  Procedure Laterality Date   COLONOSCOPY WITH PROPOFOL N/A 12/26/2014   Procedure: COLONOSCOPY WITH PROPOFOL;  Surgeon: Charolett Bumpers, MD;  Location: [facility];  Service: Endoscopy;  Laterality: N/A;   HERNIA REPAIR  10+ yrs ago   umbilical    left arm fracture  april 2005   with titanium plate   left humerus fracture  2009   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     amLODipine (NORVASC) 5 MG tablet Take 5 mg by mouth every morning.      aspirin EC 325 MG tablet Take 1 tablet (325 mg total) by mouth 2 (two) times daily. 30 tablet 0   chlorthalidone (HYGROTON) 25 MG tablet TAKE ONE TABLET BY MOUTH EVERY MORNING WITH FOOD ONCE A DAY Orally Once a day for 90 days     DULERA 100-5 MCG/ACT AERO Inhale 2 puffs into the lungs 2 (two) times daily.     JARDIANCE 25 MG TABS tablet TAKE ONE TABLET BY MOUTH DAILY Orally Once a day for 90 days     Multiple Vitamin (MULTIVITAMIN) capsule one tablet  Orally once a day     Multiple Vitamins-Minerals (MULTIVITAMIN PO) Take 1 tablet by mouth daily.      pregabalin (LYRICA) 25 MG capsule Take 25 mg by mouth 2 (two) times daily.     REPATHA SURECLICK 140 MG/ML SOAJ Inject into the skin.     rosuvastatin (CRESTOR) 5 MG tablet Take 5 mg by mouth 3 (three) times a week.     telmisartan (MICARDIS) 80 MG tablet Take 80 mg by mouth daily.     TRULICITY 3 MG/0.5ML SOPN Inject into the skin.     No current facility-administered medications for this visit.    Allergies:   Statins, Ace inhibitors, and Metformin hcl er    Social History:  The patient  reports that he has been smoking cigars. He has never used smokeless tobacco. He reports current alcohol use. He reports that he does not use drugs.   Family History:  The patient's family history includes Colon cancer in his mother; Liver cancer in his  paternal grandmother; Pancreatic cancer in his father.    ROS:  Please see the history of present illness.   Otherwise, review of systems are positive for none.   All other systems are reviewed and negative.    PHYSICAL EXAM: VS:  BP 138/60 (BP Location: Left Arm, Patient Position: Sitting, Cuff Size: Normal)   Pulse 72   Ht 5\' 8"  (1.727 m)   Wt 233 lb (105.7 kg)   SpO2 93%   BMI 35.43 kg/m  , BMI Body mass index is 35.43 kg/m. GENERAL:  Well appearing HEENT:  Pupils equal round and reactive, fundi not visualized, oral mucosa unremarkable NECK:  No jugular venous distention, waveform within normal limits, carotid upstroke brisk and symmetric, slight left bruits, no thyromegaly LYMPHATICS:  No cervical, inguinal adenopathy LUNGS:  Clear to auscultation bilaterally BACK:  No CVA tenderness CHEST:  Unremarkable HEART:  PMI not displaced or sustained,S1 and S2 within normal limits, no S3, no S4, no clicks, no rubs, very distant heart sounds, no obvious murmurs ABD:  Flat, positive bowel sounds normal in frequency in pitch, no bruits, no rebound, no guarding, no midline pulsatile mass, no hepatomegaly, no splenomegaly EXT:  2 plus pulses throughout, no edema, no cyanosis no clubbing SKIN:  No rashes no nodules NEURO:  Cranial nerves II through XII grossly intact, motor grossly intact throughout [facility]:  Cognitively intact, oriented to person place and time  EKG:  EKG Interpretation  Date/Time:  Friday February 07 2023 10:50:05 EDT Ventricular Rate:  72 PR Interval:  224 QRS Duration: 104 QT Interval:  374 QTC Calculation: 409 R Axis:   12 Text Interpretation: Sinus rhythm with 1st degree A-V block Poor anterior R wave progression. When compared with ECG of 15-Feb-2016 15:26, No significant change since last tracing Confirmed by Rollene Rotunda ([id number]) on 02/07/2023 11:12:57 AM    Recent Labs: No results found for requested labs within last 365 days.    Lipid Panel    Component  Value Date/Time   CHOL 132 01/21/2011 0520   TRIG 95 01/21/2011 0520   HDL 36 (L) 01/21/2011 0520   CHOLHDL 3.7 01/21/2011 0520   VLDL 19 01/21/2011 0520   LDLCALC  01/21/2011 0520    77        Total Cholesterol/HDL:CHD Risk Coronary Heart Disease Risk Table                     Men   Women  1/2  Average Risk   3.4   3.3  Average Risk       5.0   4.4  2 X Average Risk   9.6   7.1  3 X Average Risk  23.4   11.0        Use the calculated Patient Ratio above and the CHD Risk Table to determine the patient's CHD Risk.        ATP III CLASSIFICATION (LDL):  <100     mg/dL   Optimal  865-784  mg/dL   Near or Above                    Optimal  130-159  mg/dL   Borderline  696-295  mg/dL   High  >284     mg/dL   Very High      Wt Readings from Last 3 Encounters:  02/07/23 233 lb (105.7 kg)  02/15/16 208 lb 9.6 oz (94.6 kg)  12/26/14 190 lb (86.2 kg)      Other studies Reviewed: Additional studies/ records that were reviewed today include: Labs. Review of the above records demonstrates:  Please see elsewhere in the note.     ASSESSMENT AND PLAN:  SOB: His shortness of breath is a mild complaint with the decreased exercise tolerance and fatigue is significant.  This certainly could be an anginal equivalent.  He has significant cardiovascular risk factors.  He would not be able to walk on a treadmill.  He is going to have a Lexiscan Myoview to rule out obstructive coronary disease.  I would suggest that if his stress test is normal I would want to screen him for sleep apnea given the fact that a lot of his complaint is fatigue through the day.  He has nobody home to tell him he snores or stops breathing but he certainly has a body habitus and risk.  DM: A1c 7.1.  No change in therapy.  HTN: His blood pressure is well-tolerated.  No change in therapy.  ETOH: He has a history of alcoholism and previously had abstained.  He says he is back to drinking but not heavily.  I suggested  complete abstinence and follow-up with AA   Current medicines are reviewed at length with the patient today.  The patient does not have concerns regarding medicines.  The following changes have been made:  no change  Labs/ tests ordered today include:   Orders Placed This Encounter  Procedures   MYOCARDIAL PERFUSION IMAGING   EKG 12-Lead   VAS US CAROTID     Disposition:   FU with APP in about one month.     Signed, Rollene Rotunda, MD  02/07/2023 11:35 AM    [facility]

## 2023-02-07 ENCOUNTER — Ambulatory Visit: Payer: Medicare Other | Attending: Cardiology | Admitting: Cardiology

## 2023-02-07 ENCOUNTER — Encounter: Payer: Self-pay | Admitting: Cardiology

## 2023-02-07 VITALS — BP 138/60 | HR 72 | Ht 68.0 in | Wt 233.0 lb

## 2023-02-07 DIAGNOSIS — E118 Type 2 diabetes mellitus with unspecified complications: Secondary | ICD-10-CM | POA: Diagnosis not present

## 2023-02-07 DIAGNOSIS — R0989 Other specified symptoms and signs involving the circulatory and respiratory systems: Secondary | ICD-10-CM

## 2023-02-07 DIAGNOSIS — R0602 Shortness of breath: Secondary | ICD-10-CM | POA: Diagnosis not present

## 2023-02-07 DIAGNOSIS — I10 Essential (primary) hypertension: Secondary | ICD-10-CM | POA: Insufficient documentation

## 2023-02-07 DIAGNOSIS — Z7984 Long term (current) use of oral hypoglycemic drugs: Secondary | ICD-10-CM

## 2023-02-07 NOTE — Patient Instructions (Signed)
   Testing/Procedures:  Your physician has requested that you have a lexiscan myoview. For further information please visit [email or web address] Please follow instruction sheet, as given. [address] [facility]   Your physician has requested that you have a carotid duplex. This test is an ultrasound of the carotid arteries in your neck. It looks at blood flow through these arteries that supply the brain with blood. Allow one hour for this exam. There are no restrictions or special instructions. [facility]   Follow-Up: At [facility], you and your health needs are our priority.  As part of our continuing mission to provide you with exceptional heart care, we have created designated Provider Care Teams.  These Care Teams include your primary Cardiologist (physician) and Advanced Practice Providers (APPs -  Physician Assistants and Nurse Practitioners) who all work together to provide you with the care you need, when you need it.  We recommend signing up for the patient portal called "MyChart".  Sign up information is provided on this After Visit Summary.  MyChart is used to connect with patients for Virtual Visits (Telemedicine).  Patients are able to view lab/test results, encounter notes, upcoming appointments, etc.  Non-urgent messages can be sent to your provider as well.   To learn more about what you can do with MyChart, go to [email or web address].    Your next appointment:   6 week(s)  Provider:   ANY APP

## 2023-02-12 ENCOUNTER — Telehealth (HOSPITAL_COMMUNITY): Payer: Self-pay | Admitting: *Deleted

## 2023-02-12 NOTE — Telephone Encounter (Signed)
Patient given detailed instructions per Myocardial Perfusion Study Information Sheet for the test on 02/18/2023 at 10:00. Patient notified to arrive 15 minutes early and that it is imperative to arrive on time for appointment to keep from having the test rescheduled.  If you need to cancel or reschedule your appointment, please call the office within 24 hours of your appointment. . Patient verbalized understanding.Brendan Joseph

## 2023-02-18 ENCOUNTER — Ambulatory Visit (HOSPITAL_COMMUNITY): Payer: Medicare Other | Attending: Internal Medicine

## 2023-02-18 DIAGNOSIS — R0602 Shortness of breath: Secondary | ICD-10-CM | POA: Insufficient documentation

## 2023-02-18 LAB — MYOCARDIAL PERFUSION IMAGING
LV dias vol: 79 mL (ref 62–150)
LV sys vol: 28 mL
Nuc Stress EF: 64 %
Peak HR: 86 {beats}/min
Rest HR: 68 {beats}/min
Rest Nuclear Isotope Dose: 10.4 mCi
SDS: 6
SRS: 1
SSS: 7
ST Depression (mm): 0 mm
Stress Nuclear Isotope Dose: 32 mCi
TID: 1

## 2023-02-18 MED ORDER — REGADENOSON 0.4 MG/5ML IV SOLN
0.4000 mg | Freq: Once | INTRAVENOUS | Status: AC
  Administered 2023-02-18: 0.4 mg via INTRAVENOUS

## 2023-02-18 MED ORDER — TECHNETIUM TC 99M TETROFOSMIN IV KIT
10.4000 | PACK | Freq: Once | INTRAVENOUS | Status: AC | PRN
  Administered 2023-02-18: 10.4 via INTRAVENOUS

## 2023-02-18 MED ORDER — TECHNETIUM TC 99M TETROFOSMIN IV KIT
32.0000 | PACK | Freq: Once | INTRAVENOUS | Status: AC | PRN
  Administered 2023-02-18: 32 via INTRAVENOUS

## 2023-02-28 ENCOUNTER — Ambulatory Visit (HOSPITAL_COMMUNITY)
Admission: RE | Admit: 2023-02-28 | Discharge: 2023-02-28 | Disposition: A | Discharge status: Home / Self Care | Payer: Medicare Other | Source: Ambulatory Visit | Attending: Cardiology | Admitting: Cardiology

## 2023-02-28 DIAGNOSIS — R0989 Other specified symptoms and signs involving the circulatory and respiratory systems: Secondary | ICD-10-CM

## 2023-03-07 ENCOUNTER — Telehealth: Payer: Self-pay | Admitting: Cardiology

## 2023-03-07 ENCOUNTER — Telehealth: Payer: Self-pay | Admitting: *Deleted

## 2023-03-07 ENCOUNTER — Other Ambulatory Visit: Payer: Self-pay | Admitting: *Deleted

## 2023-03-07 DIAGNOSIS — R0683 Snoring: Secondary | ICD-10-CM

## 2023-03-07 NOTE — Telephone Encounter (Signed)
-----   Message from Rollene Rotunda sent at 02/26/2023  3:23 PM EDT ----- Filomena Jungling  ----- Message ----- From: Freddi Starr, RN Sent: 02/26/2023   3:02 PM EDT To: Rollene Rotunda, MD  [place] or split night sleep study

## 2023-03-07 NOTE — Telephone Encounter (Signed)
pt aware of results  

## 2023-03-07 NOTE — Telephone Encounter (Signed)
Patient aware. He will pick up at his appointment 7/29.

## 2023-03-07 NOTE — Telephone Encounter (Signed)
Pt returning call for stress test and carotid test results

## 2023-03-15 NOTE — Progress Notes (Signed)
Cardiology Clinic Note   Date: 03/17/2023 ID: Brendan Joseph, DOB [birth year], MRN [id number]  Primary Cardiologist:  Rollene Rotunda, MD  Patient Profile    Brendan Joseph is a 77 y.o. male who presents to the clinic today for follow up after cardiac testing.     Past medical history significant for: Shortness of breath/activity intolerance. Nuclear stress test 02/18/2023: Normal, low risk study.  Small size and mild intensity fixed defect in the apex however no apical wall motion abnormality consistent with apical thinning defect. Fatigue. Carotid artery stenosis. Carotid duplex 02/28/2023: 1 to 39% bilateral ICA. Hypertension. Hyperlipidemia. T2DM. Tobacco abuse.      History of Present Illness    Brendan Joseph evaluated by Dr. Antoine Poche on 02/07/2023 for shortness of breath and fatigue at the request of Dr. Donette Larry.  Patient reported slowly progressive shortness of breath over a few years along with fatigue.  He reported he had previously been able to golf more frequently and was down to less than once a week and sometimes unable to complete 18 holes.  Patient with STOP-BANG score of 6.  Nuclear stress test was a low risk normal study.  Small size and mild intensity fixed defect in the apex however no apical wall motion abnormality consistent with apical thinning defect.  Given patient's negative stress test it was suggested he undergo sleep study.  Today, patient is here alone. He reports he thinks shortness of breath is unchanged but he has not been golfing recently secondary to weather. He is able to climb flight of stairs at home with minimal dyspnea. He continues to smoke 1-2 cigars a day and states "I inhale." He quit smoking cigarettes in 1988. He is not interested in quitting. Fatigue is unchanged. He is here today to set up a home sleep study. Discussed results of stress test and carotid duplex.     ROS: All other systems reviewed and are otherwise negative except as noted  in History of Present Illness.  Studies Reviewed      EKG is not ordered today.  Risk Assessment/Calculations       STOP-Bang Score:  6       Physical Exam    VS:  BP 118/74   Pulse 75   Ht 5\' 8"  (1.727 m)   Wt 230 lb 9.6 oz (104.6 kg)   SpO2 95%   BMI 35.06 kg/m  , BMI Body mass index is 35.06 kg/m.  GEN: Well nourished, well developed, in no acute distress. Neck: No JVD or carotid bruits. Cardiac:  RRR. No murmurs. No rubs or gallops.   Respiratory:  Respirations regular and unlabored. Clear to auscultation without rales, wheezing or rhonchi. GI: Soft, nontender, nondistended. Extremities: Radials/DP/PT 2+ and equal bilaterally. No clubbing or cyanosis. No edema.  Skin: Warm and dry, no rash. Neuro: Strength intact.  Assessment & Plan    Shortness of breath/activity intolerance.  Nuclear stress test July 2024 was low risk, normal study.  Patient believes shortness of breath is unchanged but has not played golf recently secondary to weather. He is able to climb a flight of stairs at home with minimal dyspnea.  Fatigue.  STOP-BANG score 6. Fatigue is mainly with exertion. He reports he often falls asleep on the couch before he can get into bed. Brendan Sima, LPN provided patient with equipment and instructions for home sleep study.  Carotid artery stenosis.  Carotid duplex July 2024 showed 1 to 39% bilateral ICA.  Will repeat as  clinically indicated. Hypertension: BP today 118/74. Patient denies headaches, dizziness or vision changes. Continue amlodipine, chlorthalidone, telmisartan. Hyperlipidemia. LDL May 2024 44, at goal. Continue rosuvastatin.  Tobacco abuse. Quit smoking cigarettes in 1988. Continues to smoke 1-2 cigars a day. He is not interested in quitting.   Disposition: Home sleep study. Return in 3 months or sooner as needed.          Signed, Brendan Joseph. Brendan Tang, DNP, NP-C

## 2023-03-17 ENCOUNTER — Encounter: Payer: Self-pay | Admitting: Student

## 2023-03-17 ENCOUNTER — Ambulatory Visit: Payer: Medicare Other | Attending: Student | Admitting: Student

## 2023-03-17 VITALS — BP 118/74 | HR 75 | Ht 68.0 in | Wt 230.6 lb

## 2023-03-17 DIAGNOSIS — R0683 Snoring: Secondary | ICD-10-CM

## 2023-03-17 DIAGNOSIS — R5383 Other fatigue: Secondary | ICD-10-CM | POA: Diagnosis not present

## 2023-03-17 DIAGNOSIS — I10 Essential (primary) hypertension: Secondary | ICD-10-CM

## 2023-03-17 DIAGNOSIS — R0602 Shortness of breath: Secondary | ICD-10-CM | POA: Diagnosis not present

## 2023-03-17 DIAGNOSIS — I6523 Occlusion and stenosis of bilateral carotid arteries: Secondary | ICD-10-CM | POA: Diagnosis not present

## 2023-03-17 DIAGNOSIS — E782 Mixed hyperlipidemia: Secondary | ICD-10-CM

## 2023-03-17 DIAGNOSIS — R6889 Other general symptoms and signs: Secondary | ICD-10-CM | POA: Diagnosis not present

## 2023-03-17 NOTE — Patient Instructions (Signed)
Medication Instructions:  Your physician recommends that you continue on your current medications as directed. Please refer to the Current Medication list given to you today. If you need a refill on your cardiac medications before your next appointment, please call your pharmacy   Lab Work: none If you have labs (blood work) drawn today and your tests are completely normal, you will receive your results only by: MyChart Message (if you have MyChart) OR A paper copy in the mail If you have any lab test that is abnormal or we need to change your treatment, we will call you to review the results.   Testing/Procedures: none   Follow-Up: At [facility], you and your health needs are our priority.  As part of our continuing mission to provide you with exceptional heart care, we have created designated Provider Care Teams.  These Care Teams include your primary Cardiologist (physician) and Advanced Practice Providers (APPs -  Physician Assistants and Nurse Practitioners) who all work together to provide you with the care you need, when you need it.  We recommend signing up for the patient portal called "MyChart".  Sign up information is provided on this After Visit Summary.  MyChart is used to connect with patients for Virtual Visits (Telemedicine).  Patients are able to view lab/test results, encounter notes, upcoming appointments, etc.  Non-urgent messages can be sent to your provider as well.   To learn more about what you can do with MyChart, go to [email or web address].    Your next appointment:   3 month(s)  Provider:   Rollene Rotunda, MD     [facility] ALL SLEEP [facility] CONCERNS AT [phone]

## 2023-03-18 DIAGNOSIS — H2513 Age-related nuclear cataract, bilateral: Secondary | ICD-10-CM | POA: Diagnosis not present

## 2023-03-18 DIAGNOSIS — Z794 Long term (current) use of insulin: Secondary | ICD-10-CM | POA: Diagnosis not present

## 2023-03-18 DIAGNOSIS — E119 Type 2 diabetes mellitus without complications: Secondary | ICD-10-CM | POA: Diagnosis not present

## 2023-03-21 ENCOUNTER — Telehealth: Payer: Self-pay

## 2023-03-21 NOTE — Telephone Encounter (Signed)
Patient called about Prior Auth for Holcomb device. I informed patient we have not received Authorization yet, but I will notify him when authorized with a PIN for device activation.

## 2023-03-25 DIAGNOSIS — L570 Actinic keratosis: Secondary | ICD-10-CM | POA: Diagnosis not present

## 2023-03-25 DIAGNOSIS — L218 Other seborrheic dermatitis: Secondary | ICD-10-CM | POA: Diagnosis not present

## 2023-03-25 DIAGNOSIS — L814 Other melanin hyperpigmentation: Secondary | ICD-10-CM | POA: Diagnosis not present

## 2023-03-25 DIAGNOSIS — D485 Neoplasm of uncertain behavior of skin: Secondary | ICD-10-CM | POA: Diagnosis not present

## 2023-03-25 DIAGNOSIS — Z85828 Personal history of other malignant neoplasm of skin: Secondary | ICD-10-CM | POA: Diagnosis not present

## 2023-03-25 DIAGNOSIS — D1801 Hemangioma of skin and subcutaneous tissue: Secondary | ICD-10-CM | POA: Diagnosis not present

## 2023-03-25 DIAGNOSIS — L988 Other specified disorders of the skin and subcutaneous tissue: Secondary | ICD-10-CM | POA: Diagnosis not present

## 2023-03-25 DIAGNOSIS — L821 Other seborrheic keratosis: Secondary | ICD-10-CM | POA: Diagnosis not present

## 2023-03-25 DIAGNOSIS — D225 Melanocytic nevi of trunk: Secondary | ICD-10-CM | POA: Diagnosis not present

## 2023-04-08 DIAGNOSIS — D485 Neoplasm of uncertain behavior of skin: Secondary | ICD-10-CM | POA: Diagnosis not present

## 2023-04-08 DIAGNOSIS — L988 Other specified disorders of the skin and subcutaneous tissue: Secondary | ICD-10-CM | POA: Diagnosis not present

## 2023-04-08 DIAGNOSIS — Z85828 Personal history of other malignant neoplasm of skin: Secondary | ICD-10-CM | POA: Diagnosis not present

## 2023-05-02 NOTE — Progress Notes (Signed)
Patient agreement reviewed and signed on 03/17/2023.  WatchPAT issued to patient on 03/17/2023 by Brunetta Genera, CMA. Patient aware to not open the WatchPAT box until contacted with the activation PIN. Patient profile initialized in CloudPAT on 03/17/2023 by Brunetta Genera, CMA. Device serial number: [id number]

## 2023-05-15 DIAGNOSIS — N281 Cyst of kidney, acquired: Secondary | ICD-10-CM | POA: Diagnosis not present

## 2023-05-15 DIAGNOSIS — N401 Enlarged prostate with lower urinary tract symptoms: Secondary | ICD-10-CM | POA: Diagnosis not present

## 2023-05-15 DIAGNOSIS — R972 Elevated prostate specific antigen [PSA]: Secondary | ICD-10-CM | POA: Diagnosis not present

## 2023-05-15 DIAGNOSIS — R351 Nocturia: Secondary | ICD-10-CM | POA: Diagnosis not present

## 2023-06-27 DIAGNOSIS — J4540 Moderate persistent asthma, uncomplicated: Secondary | ICD-10-CM | POA: Diagnosis not present

## 2023-06-27 DIAGNOSIS — E7800 Pure hypercholesterolemia, unspecified: Secondary | ICD-10-CM | POA: Diagnosis not present

## 2023-06-27 DIAGNOSIS — M549 Dorsalgia, unspecified: Secondary | ICD-10-CM | POA: Diagnosis not present

## 2023-06-27 DIAGNOSIS — I700 Atherosclerosis of aorta: Secondary | ICD-10-CM | POA: Diagnosis not present

## 2023-06-27 DIAGNOSIS — D1803 Hemangioma of intra-abdominal structures: Secondary | ICD-10-CM | POA: Diagnosis not present

## 2023-06-27 DIAGNOSIS — E1122 Type 2 diabetes mellitus with diabetic chronic kidney disease: Secondary | ICD-10-CM | POA: Diagnosis not present

## 2023-06-27 DIAGNOSIS — B1920 Unspecified viral hepatitis C without hepatic coma: Secondary | ICD-10-CM | POA: Diagnosis not present

## 2023-06-27 DIAGNOSIS — Z794 Long term (current) use of insulin: Secondary | ICD-10-CM | POA: Diagnosis not present

## 2023-06-27 DIAGNOSIS — G720 Drug-induced myopathy: Secondary | ICD-10-CM | POA: Diagnosis not present

## 2023-06-27 DIAGNOSIS — I10 Essential (primary) hypertension: Secondary | ICD-10-CM | POA: Diagnosis not present

## 2023-06-27 DIAGNOSIS — N182 Chronic kidney disease, stage 2 (mild): Secondary | ICD-10-CM | POA: Diagnosis not present

## 2023-06-27 DIAGNOSIS — E1142 Type 2 diabetes mellitus with diabetic polyneuropathy: Secondary | ICD-10-CM | POA: Diagnosis not present

## 2024-07-08 ENCOUNTER — Telehealth: Payer: Self-pay

## 2024-07-08 ENCOUNTER — Ambulatory Visit (INDEPENDENT_AMBULATORY_CARE_PROVIDER_SITE_OTHER)

## 2024-07-08 VITALS — BP 137/62 | HR 91 | Temp 98.6°F | Ht 68.0 in | Wt 237.4 lb

## 2024-07-08 DIAGNOSIS — E66812 Obesity, class 2: Secondary | ICD-10-CM

## 2024-07-08 DIAGNOSIS — G4723 Circadian rhythm sleep disorder, irregular sleep wake type: Secondary | ICD-10-CM | POA: Diagnosis not present

## 2024-07-08 DIAGNOSIS — Z23 Encounter for immunization: Secondary | ICD-10-CM | POA: Diagnosis not present

## 2024-07-08 DIAGNOSIS — Z6836 Body mass index (BMI) 36.0-36.9, adult: Secondary | ICD-10-CM

## 2024-07-08 DIAGNOSIS — J4520 Mild intermittent asthma, uncomplicated: Secondary | ICD-10-CM

## 2024-07-08 DIAGNOSIS — G4733 Obstructive sleep apnea (adult) (pediatric): Secondary | ICD-10-CM | POA: Diagnosis not present

## 2024-07-08 MED ORDER — MOMETASONE FURO-FORMOTEROL FUM 200-5 MCG/ACT IN AERO: 2.0000 | INHALATION_SPRAY | Freq: Two times a day (BID) | RESPIRATORY_TRACT | 0 refills | Status: DC

## 2024-07-08 NOTE — Patient Instructions (Signed)
  VISIT SUMMARY:  Today, we discussed your breathing issues and sleep apnea. You mentioned that your shortness of breath has worsened over the years, and you have stopped using your CPAP machine due to discomfort. We reviewed your asthma management and discussed the importance of consistent CPAP use for your sleep apnea and overall health.  YOUR PLAN:  -DYSPNEA AND ASTHMA: Dyspnea means difficulty breathing, and asthma is a condition where your airways narrow and swell, producing extra mucus. We increased your Dulera  dosage to 200 mcg to help manage your asthma symptoms better. We also ordered a lung function test to further evaluate your breathing issues. Please continue using your albuterol  as needed and follow up in three months.  -OBSTRUCTIVE SLEEP APNEA: Obstructive sleep apnea is a condition where your breathing repeatedly stops and starts during sleep. We discussed the importance of using your CPAP machine consistently, as it can help with your breathing, blood pressure, and glucose control. We will obtain your sleep study report from [facility] to better understand your condition.  -CIRCADIAN RHYTHM SLEEP DISORDER, IRREGULAR SLEEP WAKE TYPE: This disorder means your sleep pattern is irregular, making it hard for you to maintain a regular sleep schedule. Using your CPAP machine can help improve your sleep quality. We discussed the benefits of CPAP on your overall sleep health.                       Contains text generated by Abridge.                                 Contains text generated by Abridge.

## 2024-07-08 NOTE — Telephone Encounter (Signed)
 Contacted PCP and requested a copy of sleep study.  Mitzie with medical records stated that she would fax report.  Will hold encounter to ensure follow up.

## 2024-07-08 NOTE — Progress Notes (Signed)
 Pulmonology Office Visit   Subjective:  Patient ID: Brendan Joseph, male    DOB: 01-28-1946  MRN: [id number]  Referred by: Ransom Other, MD  CC:  Chief Complaint  Patient presents with   Consult    Recent sleep study- can not tolerate cpap. SOB with exertion.     HPI Brendan Joseph is a 78 y.o. male with Asthma, depression, hyperlipidemia, DM 2, hypertension,?OHS [nocturnal hypoxemia] presents for evaluation of OSA and asthma.  Respective notes from provider reviewed as appropriate to gather relevant information for patient care.   Discussed the use of AI scribe software for clinical note transcription with the patient, who gave verbal consent to proceed.  History of Present Illness   Brendan Joseph is a 78 year old male with asthma who presents with dyspnea and sleep apnea. He was referred by his primary care physician for evaluation of breathing issues and sleep apnea.  He experiences dyspnea that has progressively worsened over the past few years. Previously active, he played golf four to five days a week but now experiences significant fatigue and shortness of breath after minimal exertion, such as walking 50 yards. His dyspnea is more pronounced with exertion. No chest pain or wheezing. Occasionally, he experiences a whistling sound in his chest and coughs up yellow-green phlegm.  He was diagnosed with sleep apnea in April or May and used a CPAP machine for three months over the summer but discontinued it due to discomfort. He reports an average of one apnea episode per hour during CPAP use. He sleeps erratically on a sofa with the TV on and does not use supplemental oxygen at night. No snoring or observed apneas by others.  He has a history of asthma diagnosed 15-20 years ago, initially exercise-induced. He is currently on Dulera  and uses albuterol  once or twice a month for coughing spells. Physical activity triggers his asthma symptoms. No recent need for steroids and has  not been on a ventilator.  He has type 2 diabetes and experiences occasional leg swelling. He quit smoking cigarettes in 1988 after smoking half a pack a day for 22 years and currently smokes cigars occasionally. No exposure to chemicals or fumes in his previous occupation as an cytogeneticist. No family history of asthma except for his mother, who passed away in 51.       ASTHMA:  First diagnosed: in his 69s. Dx a exercise induced asthma.  FH: mother.  Triggers: physical activity.  Intubated: no Last steroid use: long time ago.  Times albuterol  used: 1-2 times a month.  Treatment: on Dulera  100 Others: allergy symptoms- no, GERD- yes, OSA- yes   OSA history: OSA April 2025 AHI 17> CPAP June-Aug 2025  Lung Health: gradual DOE.  Functional status: 50 yards.  Covid vaccine: wants COVID vaccine.  Influenza vaccine: needs.  Pneumonococcal vaccine: UTD.  Smoking: cigar every 2-3 days. Quit cig 1988. 1/2 pk x 22 years.  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 No exposures. Used to have cats and dogs and horses. No more pets since 2 years.   PRIOR TESTS and IMAGING: No recent chest imaging. Stress test 2024: EF 64%.  Low risk study.      No data to display          Allergies: Statins, Ace inhibitors, and Metformin hcl er  Current Outpatient Medications:    albuterol  (PROVENTIL  HFA;VENTOLIN  HFA) 108 (90 BASE) MCG/ACT inhaler, Inhale into the lungs every 6 (six) hours as needed for  wheezing or shortness of breath., Disp: , Rfl:    amLODipine  (NORVASC ) 5 MG tablet, Take 5 mg by mouth every morning. , Disp: , Rfl:    aspirin  EC 325 MG tablet, Take 1 tablet (325 mg total) by mouth 2 (two) times daily., Disp: 30 tablet, Rfl: 0   chlorthalidone (HYGROTON) 25 MG tablet, TAKE ONE TABLET BY MOUTH EVERY MORNING WITH FOOD ONCE A DAY Orally Once a day for 90 days, Disp: , Rfl:    Insulin Glargine (BASAGLAR KWIKPEN) 100 UNIT/ML, , Disp: , Rfl:    mometasone -formoterol  (DULERA ) 200-5  MCG/ACT AERO, Inhale 2 puffs into the lungs in the morning and at bedtime., Disp: 13 g, Rfl: 0   Multiple Vitamin (MULTIVITAMIN) capsule, one tablet Orally once a day, Disp: , Rfl:    Multiple Vitamins-Minerals (MULTIVITAMIN PO), Take 1 tablet by mouth daily. , Disp: , Rfl:    pregabalin (LYRICA) 25 MG capsule, Take 25 mg by mouth 2 (two) times daily., Disp: , Rfl:    REPATHA SURECLICK 140 MG/ML SOAJ, Inject into the skin., Disp: , Rfl:    rosuvastatin (CRESTOR) 5 MG tablet, Take 5 mg by mouth 3 (three) times a week., Disp: , Rfl:    telmisartan (MICARDIS) 80 MG tablet, Take 80 mg by mouth daily., Disp: , Rfl:    TRULICITY 3 MG/0.5ML SOPN, Inject into the skin., Disp: , Rfl:    JARDIANCE 25 MG TABS tablet, TAKE ONE TABLET BY MOUTH DAILY Orally Once a day for 90 days (Patient not taking: Reported on 07/08/2024), Disp: , Rfl:  Past Medical History:  Diagnosis Date   Asthma, persistent    Albuterol  as needed.Symbicort daily   Bilateral carpal tunnel syndrome    Chronic hepatitis C (HCC)    tx. with meds-now showing no enzyme elevation   Depression    Diabetes mellitus, type II (HCC)    controlled by diet and weight loss   Family hx of colon cancer    GERD (gastroesophageal reflux disease)    occasionally and will take OTC med if needs   History of colon polyps    benign   Hyperlipidemia    takes Crestor    Hypertension    takes Micardis and Amlodipine  daily   Right knee DJD    DJD- right knee cortisone shot 1 month ago   Past Surgical History:  Procedure Laterality Date   COLONOSCOPY WITH PROPOFOL  N/A 12/26/2014   Procedure: COLONOSCOPY WITH PROPOFOL ;  Surgeon: Gladis MARLA Louder, MD;  Location: [facility];  Service: Endoscopy;  Laterality: N/A;   HERNIA REPAIR  10+ yrs ago   umbilical   left arm fracture  april 2005   with titanium plate   left humerus fracture  2009   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   Procedure: RIGHT TOTAL  KNEE ARTHROPLASTY;  Surgeon: Dempsey Sensor, MD;  Location: [facility];  Service: Orthopedics;  Laterality: Right;   Family History  Problem Relation Age of Onset   Colon cancer Mother    Pancreatic cancer Father    Liver cancer Paternal Grandmother    Social History   Socioeconomic History   Marital status: Divorced    Spouse name: Not on file   Number of children: Not on file   Years of education: Not on file   Highest education level: Not on file  Occupational History   Not on file  Tobacco Use   Smoking status: Every Day  Types: Cigars   Smokeless tobacco: Never   Tobacco comments:    2 per day  Substance and Sexual Activity   Alcohol use: Yes    Comment: occ beer   Drug use: No   Sexual activity: Not on file  Other Topics Concern   Not on file  Social History Narrative   Tobacco use cigarettes: former smoker, quit in year 1988, pack-year Hx: 10, tobacco history last updated 04/05/2013, additional findings: tobacco non-user ex-heavy cigarette smoker (20-30/day). No smoking. Tobacco exposure: currently cigars 2 or 3 a day. 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 Caffeine: yes. No recreational drug use. Exercise: golf twice a week, walking dogs daily. 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 Marital status: divorced.   Social Drivers of Corporate Investment Banker Strain: Not on file  Food Insecurity: Not on file  Transportation Needs: Not on file  Physical Activity: Not on file  Stress: Not on file  Social Connections: Not on file  Intimate Partner Violence: Not on file       Objective:  BP 137/62 (BP Location: Right Arm, Cuff Size: Normal)   Pulse 91   Temp 98.6 F (37 C) (Oral)   Ht 5' 8 (1.727 m)   Wt 237 lb 6.4 oz (107.7 kg)   SpO2 94%   BMI 36.10 kg/m  BMI Readings from Last 3 Encounters:  07/08/24 36.10 kg/m  03/17/23 35.06 kg/m  02/07/23 35.43 kg/m    Physical Exam: Physical Exam   ENT: Normal mucosa. No hypertrophy  of inferior turbinates. Tonsils are normal sized. Modified Mallampati score is 4. Pharynx normal. PULMONARY: Lungs clear to auscultation bilaterally, no adventitious breath sounds. CARDIOVASCULAR: Regular rate and rhythm, S1 S2 normal, no murmurs. ABDOMEN: Abdomen soft, nontender. Bowel sounds are normal. EXTREMITIES: pitting b/l Pedal edema.       Diagnostic Review:  Last metabolic panel Lab Results  Component Value Date   GLUCOSE 143 (H) 02/29/2016   NA 135 02/29/2016   K 3.9 02/29/2016   CL 105 02/29/2016   CO2 24 02/29/2016   BUN 8 02/29/2016   CREATININE 0.79 02/29/2016   GFRNONAA >60 02/29/2016   CALCIUM 8.1 (L) 02/29/2016   PHOS 4.4 01/21/2011   PROT 7.1 01/20/2011   ALBUMIN 3.6 01/20/2011   BILITOT 0.4 01/20/2011   ALKPHOS 97 01/20/2011   AST 29 01/20/2011   ALT 33 01/20/2011   ANIONGAP 6 02/29/2016         Assessment & Plan:   Assessment & Plan OSA (obstructive sleep apnea)     Obesity, class 2     Irregular sleep-wake rhythm     Needs flu shot     Intermittent asthma without complication, unspecified asthma severity  Orders:   Pulmonary Function Test; Future     Assessment and Plan    Dyspnea and asthma Chronic dyspnea with exertional symptoms and cough. Asthma managed with Dulera  and albuterol . Possible COPD due to smoking history. - Increased Dulera  to 200 mcg. - Ordered lung function test. - Scheduled follow-up in three months. - will get ECHO in future as he has leg swelling, to evaluate for DOE.   Obstructive sleep apnea AHI of 17. Discontinued CPAP due to discomfort. Nocturnal oxygen desaturation suspected. CPAP recommended for breathing, blood pressure, and glucose control. - Encouraged consistent CPAP use. - Will obtain sleep study report from [facility] physicians.  - Discussed CPAP benefits on health. - patient still not ready to use CPAP.   Circadian rhythm  sleep disorder, irregular sleep wake type Irregular sleep-wake  pattern with erratic schedule. Nocturnal oxygen desaturation suspected. CPAP recommended to improve sleep quality. - Encouraged CPAP use during sleep. - Discussed CPAP benefits on sleep quality. - patient is happy with his irregular sleep wake cycle and does not want to change it.         Notes from PCP 06/16/24 reviewed as to gather relevant information for patient care and formulating plan.  He/ was counselled about not driving while drowsy which is common side effect of sleep related disorders.   No follow-ups on file.   I personally spent a total of 30 minutes in the care of the patient today including preparing to see the patient, getting/reviewing separately obtained history, performing a medically appropriate exam/evaluation, counseling and educating, placing orders, documenting clinical information in the EHR, independently interpreting results, and communicating results.   Keir Viernes, MD

## 2024-07-14 ENCOUNTER — Ambulatory Visit: Payer: Self-pay

## 2024-07-14 NOTE — Telephone Encounter (Signed)
 Patient's compliance report reviewed.  His 90-day compliance report from July to September 2025 shows that he was using his CPAP regularly with low residual AHI.

## 2024-08-02 ENCOUNTER — Other Ambulatory Visit: Payer: Self-pay

## 2024-08-04 ENCOUNTER — Ambulatory Visit (INDEPENDENT_AMBULATORY_CARE_PROVIDER_SITE_OTHER)

## 2024-08-04 DIAGNOSIS — J4520 Mild intermittent asthma, uncomplicated: Secondary | ICD-10-CM | POA: Diagnosis not present

## 2024-08-04 LAB — PULMONARY FUNCTION TEST
DL/VA % pred: 108 %
DL/VA: 4.33 ml/min/mmHg/L
DLCO unc % pred: 106 %
DLCO unc: 24.95 ml/min/mmHg
FEF 25-75 Post: 2.4 L/s
FEF 25-75 Pre: 2.74 L/s
FEF2575-%Change-Post: -12 %
FEF2575-%Pred-Post: 120 %
FEF2575-%Pred-Pre: 137 %
FEV1-%Change-Post: -1 %
FEV1-%Pred-Post: 95 %
FEV1-%Pred-Pre: 96 %
FEV1-Post: 2.65 L
FEV1-Pre: 2.69 L
FEV1FVC-%Change-Post: -5 %
FEV1FVC-%Pred-Pre: 111 %
FEV6-%Change-Post: 3 %
FEV6-%Pred-Post: 95 %
FEV6-%Pred-Pre: 92 %
FEV6-Post: 3.45 L
FEV6-Pre: 3.33 L
FEV6FVC-%Pred-Post: 107 %
FEV6FVC-%Pred-Pre: 107 %
FVC-%Change-Post: 4 %
FVC-%Pred-Post: 90 %
FVC-%Pred-Pre: 86 %
FVC-Post: 3.49 L
FVC-Pre: 3.34 L
Post FEV1/FVC ratio: 76 %
Post FEV6/FVC ratio: 100 %
Pre FEV1/FVC ratio: 81 %
Pre FEV6/FVC Ratio: 100 %
RV % pred: 143 %
RV: 3.53 L
TLC % pred: 108 %
TLC: 7.23 L

## 2024-08-04 NOTE — Patient Instructions (Signed)
 Full PFT performed today.

## 2024-08-04 NOTE — Progress Notes (Signed)
 Full PFT performed today.

## 2024-08-05 ENCOUNTER — Ambulatory Visit: Payer: Self-pay

## 2024-08-06 NOTE — Progress Notes (Signed)
 ATC x1. LVMTCB

## 2024-08-09 NOTE — Telephone Encounter (Signed)
 Copied from CRM #[id number]. Topic: Clinical - Lab/Test Results >> Aug 09, 2024  8:53 AM Leila C wrote: Reason for CRM: Patient is returning the office/Megan's call. Per CAL, CMA is unavailable, send a crm. Please call back [phone].  Called and spoke with the pt and advised of Dr. Theodoro note. Pt verbalized understanding and will keep ov scheduled for 10/13/24. Nothing further needed.

## 2024-10-13 ENCOUNTER — Ambulatory Visit
